# Patient Record
Sex: Male | Born: 1965 | Race: White | Hispanic: No | Marital: Married | Smoking: Never smoker
Health system: Southern US, Community
[De-identification: ages and names within clinical notes are randomized; demographics above are authoritative.]

## PROBLEM LIST (undated history)

## (undated) DIAGNOSIS — F32A Depression, unspecified: Secondary | ICD-10-CM

## (undated) DIAGNOSIS — F329 Major depressive disorder, single episode, unspecified: Secondary | ICD-10-CM

## (undated) DIAGNOSIS — F319 Bipolar disorder, unspecified: Secondary | ICD-10-CM

## (undated) DIAGNOSIS — E785 Hyperlipidemia, unspecified: Secondary | ICD-10-CM

## (undated) DIAGNOSIS — I10 Essential (primary) hypertension: Secondary | ICD-10-CM

## (undated) HISTORY — DX: Bipolar disorder, unspecified: F31.9

## (undated) HISTORY — DX: Major depressive disorder, single episode, unspecified: F32.9

## (undated) HISTORY — DX: Depression, unspecified: F32.A

---

## 2013-10-16 ENCOUNTER — Encounter: Payer: Self-pay | Admitting: Family Medicine

## 2013-10-16 ENCOUNTER — Ambulatory Visit (INDEPENDENT_AMBULATORY_CARE_PROVIDER_SITE_OTHER): Payer: 59 | Admitting: Family Medicine

## 2013-10-16 VITALS — BP 121/80 | HR 106 | Ht 71.0 in | Wt 226.0 lb

## 2013-10-16 DIAGNOSIS — F319 Bipolar disorder, unspecified: Secondary | ICD-10-CM | POA: Insufficient documentation

## 2013-10-16 DIAGNOSIS — R0989 Other specified symptoms and signs involving the circulatory and respiratory systems: Secondary | ICD-10-CM

## 2013-10-16 DIAGNOSIS — R0609 Other forms of dyspnea: Secondary | ICD-10-CM

## 2013-10-16 DIAGNOSIS — Z131 Encounter for screening for diabetes mellitus: Secondary | ICD-10-CM

## 2013-10-16 DIAGNOSIS — R0683 Snoring: Secondary | ICD-10-CM | POA: Insufficient documentation

## 2013-10-16 DIAGNOSIS — Z13220 Encounter for screening for lipoid disorders: Secondary | ICD-10-CM

## 2013-10-16 DIAGNOSIS — G479 Sleep disorder, unspecified: Secondary | ICD-10-CM

## 2013-10-16 NOTE — Progress Notes (Signed)
CC: Mark Stevens is a 48 y.o. male is here for Establish Care and Sleep Apnea   Subjective: HPI:  Very pleasant 48 year old here to establish care  Reports that he was once diagnosed with obstructive sleep apnea and prescribed a CPAP machine he admits that he used this intermittently he is not sure if it helped much but he believes it was somewhat of an annoyance due to discomfort that he felt better not using it rather than using it. This was many years ago. His wife has noticed that he stops breathing during his sleep and snores throughout the night. He often appears startled when he briefly stopped breathing. This occurs every night it has not been getting better or worse over the past at least 3+ months. Nothing particularly makes better or worse.  He admits to occasional nonrestorative sleep, occasional daytime sleepiness mild in severity.  Patient states he has not been screened for diabetes or dyslipidemia in the last year  Review of Systems - General ROS: negative for - chills, fever, night sweats, weight gain or weight loss Ophthalmic ROS: negative for - decreased vision Psychological ROS: negative for - anxiety or depression ENT ROS: negative for - hearing change, nasal congestion, tinnitus or allergies Hematological and Lymphatic ROS: negative for - bleeding problems, bruising or swollen lymph nodes Breast ROS: negative Respiratory ROS: no cough, shortness of breath, or wheezing Cardiovascular ROS: no chest pain or dyspnea on exertion Gastrointestinal ROS: no abdominal pain, change in bowel habits, or black or bloody stools Genito-Urinary ROS: negative for - genital discharge, genital ulcers, incontinence or abnormal bleeding from genitals Musculoskeletal ROS: negative for - joint pain or muscle pain Neurological ROS: negative for - headaches or memory loss Dermatological ROS: negative for lumps, mole changes, rash and skin lesion changes  Past Medical History  Diagnosis Date   . Depression   . Bipolar 1 disorder      Family History  Problem Relation Age of Onset  . Prostate cancer      uncle  . Atrial fibrillation Father   . Congestive Heart Failure       History  Substance Use Topics  . Smoking status: Never Smoker   . Smokeless tobacco: Not on file  . Alcohol Use: No     Objective: Filed Vitals:   10/16/13 1508  BP: 121/80  Pulse: 106    General: Alert and Oriented, No Acute Distress HEENT: Pupils equal, round, reactive to light. Conjunctivae clear.  External ears unremarkable, canals clear with intact TMs with appropriate landmarks.  Middle ear appears open without effusion. Pink inferior turbinates.  Moist mucous membranes, pharynx without inflammation nor lesions.  Neck supple without palpable lymphadenopathy nor abnormal masses. Lungs: Clear to auscultation bilaterally, no wheezing/ronchi/rales.  Comfortable work of breathing. Good air movement. Cardiac: Regular rate and rhythm. Normal S1/S2.  No murmurs, rubs, nor gallops.   Abdomen: Obese Extremities: No peripheral edema.  Strong peripheral pulses.  Mental Status: No depression, anxiety, nor agitation. Skin: Warm and dry.  Assessment & Plan: Lamonta was seen today for establish care and sleep apnea.  Diagnoses and associated orders for this visit:  Sleep disturbance - Split night study; Future  Snoring - Split night study; Future  Diabetes mellitus screening - BASIC METABOLIC PANEL WITH GFR  Screening, lipid - Lipid Profile  Bipolar 1 disorder  Other Orders - Cancel: Nocturnal polysomnography (NPSG); Future    We will be ordering split sleep study for diagnosis and titration of my suspicion of  objective sleep apnea. Currently this is uncontrolled. He is due for lipid screening and diabetic screening he is not fasting so this done next week He declines flu shot today  Return if symptoms worsen or fail to improve.

## 2013-11-18 ENCOUNTER — Ambulatory Visit (HOSPITAL_BASED_OUTPATIENT_CLINIC_OR_DEPARTMENT_OTHER): Payer: 59 | Admitting: Radiology

## 2013-11-23 ENCOUNTER — Ambulatory Visit (HOSPITAL_BASED_OUTPATIENT_CLINIC_OR_DEPARTMENT_OTHER): Payer: 59 | Attending: Family Medicine | Admitting: Radiology

## 2013-11-23 VITALS — Ht 70.0 in | Wt 226.0 lb

## 2013-11-23 DIAGNOSIS — G479 Sleep disorder, unspecified: Secondary | ICD-10-CM

## 2013-11-23 DIAGNOSIS — G4733 Obstructive sleep apnea (adult) (pediatric): Secondary | ICD-10-CM | POA: Insufficient documentation

## 2013-11-23 DIAGNOSIS — R0683 Snoring: Secondary | ICD-10-CM

## 2013-11-26 DIAGNOSIS — R0989 Other specified symptoms and signs involving the circulatory and respiratory systems: Secondary | ICD-10-CM

## 2013-11-26 DIAGNOSIS — R0609 Other forms of dyspnea: Secondary | ICD-10-CM

## 2013-11-26 DIAGNOSIS — G479 Sleep disorder, unspecified: Secondary | ICD-10-CM

## 2013-11-26 NOTE — Sleep Study (Signed)
   NAME: Mark Stevens DATE OF BIRTH:  08-Mar-1966 MEDICAL RECORD NUMBER [id number]  LOCATION: [facility]  PHYSICIAN: YOUNG,CLINTON D  DATE OF STUDY: 11/23/2013  SLEEP STUDY TYPE: Nocturnal Polysomnogram               REFERRING PHYSICIAN: Marcial Pacas, DO  INDICATION FOR STUDY: Hypersomnia with sleep apnea  EPWORTH SLEEPINESS SCORE:   7/24   HEIGHT: 5\' 10"  (177.8 cm)  WEIGHT: 226 lb (102.513 kg)    Body mass index is 32.43 kg/(m^2).  NECK SIZE: 17 in.  MEDICATIONS: Charted for review  SLEEP ARCHITECTURE: Total sleep time 354.5 minutes with sleep efficiency 86.8%. Stage I was 11%, stage II 60.5%, stage III absent, REM 28.5% of total sleep time. Sleep latency 31 minutes, REM latency 81 minutes, awake after sleep onset 23 minutes, arousal index 28.9. Bedtime medication: None  RESPIRATORY DATA: Apnea hypopneas index (AHI) of 5.9 per hour. 35 total events counted of which 2 were central apneas, 33 hypopneas. Events were more common while supine. REM AHI 13.1 per hour. There were not enough events to qualify for split protocol CPAP titration on this study.  OXYGEN DATA: Moderately loud snoring with oxygen desaturation to a nadir of 85% and a mean oxygen saturation through the study of 93.6% on room air.  CARDIAC DATA: Sinus rhythm with PACs and PVCs  MOVEMENT/PARASOMNIA: A few limb jerks were counted with little effect on sleep. Bathroom x1.  IMPRESSION/ RECOMMENDATION:   1) Minimal obstructive sleep apnea/hypoxia syndrome, AHI 5.9 per hour with events more common while supine. REM AHI 13.1 per hour. The normal range for adults is an AHI of 0-5 events per hour. Moderately loud snoring with oxygen desaturation to a nadir of 85% and a mean oxygen saturation through the study of 93.6% on room air. 2) Scores in this range are usually managed conservatively. Weight loss and encouragement to sleep off flat of back pain be helpful. An oral appliance or CPAP would be  individual considerations.   Signed Baird Lyons M.D. Deneise Lever Diplomate, American Board of Sleep Medicine  ELECTRONICALLY SIGNED ON:  11/26/2013, 1:02 PM [place] PH: [phone]   FX: [phone] [place]

## 2013-12-03 ENCOUNTER — Telehealth: Payer: Self-pay | Admitting: Family Medicine

## 2013-12-03 DIAGNOSIS — G4733 Obstructive sleep apnea (adult) (pediatric): Secondary | ICD-10-CM

## 2013-12-03 NOTE — Telephone Encounter (Signed)
Seth Bake, Will you please let patient know that his sleep study revealed mild sleep apnea.  At this severity it is typically a patient preference regarding treatment being either weight loss and/or CPAP.  If he is interested in CPAP please let me know and we can see if [facility] can help arrange this accomodation.

## 2013-12-03 NOTE — Telephone Encounter (Signed)
Pt's spouse notified. He does want to go ahead and proceed with Cpap machine.

## 2013-12-07 MED ORDER — AMBULATORY NON FORMULARY MEDICATION: Status: DC

## 2013-12-07 NOTE — Telephone Encounter (Signed)
Seth Bake, Can you please see if [facility] or another provider can help arrange this, order has been signed and placed in your inbox.

## 2013-12-09 NOTE — Telephone Encounter (Signed)
This order and and notes were faxed on the 9th. Pt called up front since he had not heard anything from [facility]. Mardene Celeste called [facility] and they did receive the order but they said they didn't receive the notes. refaxing notes

## 2013-12-10 ENCOUNTER — Other Ambulatory Visit: Payer: Self-pay | Admitting: *Deleted

## 2013-12-10 MED ORDER — AMBULATORY NON FORMULARY MEDICATION: Status: DC

## 2013-12-24 ENCOUNTER — Encounter: Payer: Self-pay | Admitting: Family Medicine

## 2013-12-29 ENCOUNTER — Telehealth: Payer: Self-pay | Admitting: *Deleted

## 2013-12-29 NOTE — Telephone Encounter (Signed)
Pt called because he was upset that no one has let him know what the sleep study report said. I did tell the pt that I did call him but on his vm the name says Ronalee Belts instead of Audry so I didn't leave a message with results because I didn't know it that was his vm. So I called Florentina Jenny downstairs to verify that that was her husband and she did tell me yes and I did tell her the results and she  told me at that time that he wanted to proceed with CPAP. I did apologize to patient and I only spoke with karla about the patient because she was with him at his appt and was aware of what was going on. Pt states that wasn't her understanding. I then asked if he wanted me to read the results to him and he agreed. i read him the phone note Dr. Ileene Rubens had wrote in his chart. He then asked to be transferred up front so that he could schedule an appt. Pt was transferred to [facility]

## 2013-12-31 ENCOUNTER — Encounter: Payer: Self-pay | Admitting: Family Medicine

## 2013-12-31 ENCOUNTER — Ambulatory Visit: Payer: 59 | Admitting: Family Medicine

## 2013-12-31 ENCOUNTER — Ambulatory Visit (INDEPENDENT_AMBULATORY_CARE_PROVIDER_SITE_OTHER): Payer: 59 | Admitting: Family Medicine

## 2013-12-31 VITALS — BP 144/87 | HR 85 | Wt 226.0 lb

## 2013-12-31 DIAGNOSIS — G4733 Obstructive sleep apnea (adult) (pediatric): Secondary | ICD-10-CM

## 2013-12-31 NOTE — Progress Notes (Signed)
CC: Mark Stevens is a 48 y.o. male is here for f/u sleep study   Subjective: HPI:  Followup sleep apnea: Since March 25 he has been using CPAP on a nightly basis for greater than 4 hours every night of the week. His wife states he Stevens longer snores. Patient states that he is feeling much more rested when he awakes in the morning. Becomes fatigued late in the evening but Stevens fatigue at all during the daytime. Denies any intolerance or side effects to using CPAP machine.  Denies headache, shortness of breath, chest pain, cough, wheezing, nor mental disturbance   Review Of Systems Outlined In HPI  Past Medical History  Diagnosis Date  . Depression   . Bipolar 1 disorder     Stevens past surgical history on file. Family History  Problem Relation Age of Onset  . Prostate cancer      uncle  . Atrial fibrillation Father   . Congestive Heart Failure      History   Social History  . Marital Status: Married    Spouse Name: N/A    Number of Children: N/A  . Years of Education: N/A   Occupational History  . Not on file.   Social History Main Topics  . Smoking status: Never Smoker   . Smokeless tobacco: Not on file  . Alcohol Use: Stevens  . Drug Use: Stevens  . Sexual Activity: Not Currently    Partners: Female   Other Topics Concern  . Not on file   Social History Narrative  . Stevens narrative on file     Objective: BP 144/87  Pulse 85  Wt 226 lb (102.513 kg)  General: Alert and Oriented, Stevens Acute Distress HEENT: Pupils equal, round, reactive to light. Conjunctivae clear.  Moist membranes pharynx are remarkable Lungs: Clear to auscultation bilaterally, Stevens wheezing/ronchi/rales.  Comfortable work of breathing. Good air movement. Cardiac: Regular rate and rhythm. Normal S1/S2.  Stevens murmurs, rubs, nor gallops.   Mental Status: Stevens depression, anxiety, nor agitation. Skin: Warm and dry.  Assessment & Plan: Mark Stevens was seen today for f/u sleep study.  Diagnoses and associated orders for this  visit:  Obstructive sleep apnea     Obstructive sleep apnea: Controlled continue nightly CPAP use, I like him to provide me with a diary of blood pressures over the next 2 weeks as I would expect his blood pressure should be improving with continued CPAP use. He can drop this off in our office anytime To determine if he needs more closer followup   Return in about 3 months (around 04/01/2014), or if symptoms worsen or fail to improve.

## 2013-12-31 NOTE — Patient Instructions (Signed)
Please check your blood pressure three times a week and drop it off for me to review in two weeks.

## 2014-01-21 ENCOUNTER — Encounter: Payer: Self-pay | Admitting: Family Medicine

## 2014-01-29 ENCOUNTER — Telehealth: Payer: Self-pay | Admitting: Family Medicine

## 2014-01-29 NOTE — Telephone Encounter (Signed)
Mark Stevens, Will you please thank Mark Stevens and his wife Mark Stevens) for dropping off his BP numbers last week.  I forgot to communicate to them that the numbers look fantastic. FU around July for routine f/u of sleep apnea.

## 2014-01-29 NOTE — Telephone Encounter (Signed)
Pt.notified

## 2014-02-11 ENCOUNTER — Encounter: Payer: Self-pay | Admitting: Family Medicine

## 2014-02-25 ENCOUNTER — Encounter: Payer: Self-pay | Admitting: Family Medicine

## 2014-02-25 ENCOUNTER — Ambulatory Visit (INDEPENDENT_AMBULATORY_CARE_PROVIDER_SITE_OTHER): Payer: 59 | Admitting: Family Medicine

## 2014-02-25 VITALS — BP 135/82 | HR 70 | Wt 221.0 lb

## 2014-02-25 DIAGNOSIS — L919 Hypertrophic disorder of the skin, unspecified: Secondary | ICD-10-CM

## 2014-02-25 DIAGNOSIS — L918 Other hypertrophic disorders of the skin: Secondary | ICD-10-CM

## 2014-02-25 DIAGNOSIS — L909 Atrophic disorder of skin, unspecified: Secondary | ICD-10-CM

## 2014-02-25 DIAGNOSIS — L089 Local infection of the skin and subcutaneous tissue, unspecified: Secondary | ICD-10-CM

## 2014-02-25 NOTE — Progress Notes (Signed)
CC: Mark Stevens is a 48 y.o. male is here for skin tag removal   Subjective: HPI:  Painful growths on the skin had been present for the past year that are enlarging on a daily basis. Pain is mild in severity, present on a daily basis, described only as pain, nonradiating, worse with friction, worse when wearing shirts. Nothing particularly makes better. No interventions as of yet. Denies personal history or family history of skin cancer. Denies fevers, chills, nausea, bleeding from wounds, nor unintentional weight loss   Review Of Systems Outlined In HPI  Past Medical History  Diagnosis Date  . Depression   . Bipolar 1 disorder     No past surgical history on file. Family History  Problem Relation Age of Onset  . Prostate cancer      uncle  . Atrial fibrillation Father   . Congestive Heart Failure      History   Social History  . Marital Status: Married    Spouse Name: N/A    Number of Children: N/A  . Years of Education: N/A   Occupational History  . Not on file.   Social History Main Topics  . Smoking status: Never Smoker   . Smokeless tobacco: Not on file  . Alcohol Use: No  . Drug Use: No  . Sexual Activity: Not Currently    Partners: Female   Other Topics Concern  . Not on file   Social History Narrative  . No narrative on file     Objective: BP 135/82  Pulse 70  Wt 221 lb (100.245 kg)  Vital signs reviewed. General: Alert and Oriented, No Acute Distress HEENT: Pupils equal, round, reactive to light. Conjunctivae clear.  External ears unremarkable.  Moist mucous membranes. Lungs: Clear and comfortable work of breathing, speaking in full sentences without accessory muscle use. Cardiac: Regular rate and rhythm.  Neuro: CN II-XII grossly intact, gait normal. Extremities: No peripheral edema.  Strong peripheral pulses.  Mental Status: No depression, anxiety, nor agitation. Logical though process. Skin: Warm and dry. Tender and inflamed skin tags  located underneath the right eyebrow x1, right X3, left X2 all of which are quite tender to the touch and erythematous.  Assessment & Plan: [name] was seen today for skin tag removal.  Diagnoses and associated orders for this visit:  Inflamed skin tag    Discussed different methods of removing the skin tags which are causing pain. He ultimately decided to have each removed in this fashion below. After verbal consent was obtained chlorhexidine was used to clean all sites of the skin tags, using forceps and sterile scissors skin tags were removed at the base after anesthetizing with topical cold spray. Hemostasis was easily achieved at all sites with aluminum chloride and gentle pressure. Discussed wound care and signs and symptoms that would require reevaluation.  25 minutes spent face-to-face during visit today of which at least 50% was counseling or coordinating care regarding: 1. Inflamed skin tag      Return if symptoms worsen or fail to improve.

## 2014-04-27 ENCOUNTER — Telehealth: Payer: Self-pay | Admitting: Family Medicine

## 2014-04-27 MED ORDER — AMBULATORY NON FORMULARY MEDICATION: Status: DC

## 2014-04-27 NOTE — Telephone Encounter (Signed)
Switching resp supply company

## 2014-05-19 ENCOUNTER — Ambulatory Visit (INDEPENDENT_AMBULATORY_CARE_PROVIDER_SITE_OTHER): Payer: 59 | Admitting: Family Medicine

## 2014-05-19 ENCOUNTER — Encounter: Payer: Self-pay | Admitting: Family Medicine

## 2014-05-19 VITALS — BP 129/80 | HR 77 | Wt 226.0 lb

## 2014-05-19 DIAGNOSIS — L237 Allergic contact dermatitis due to plants, except food: Secondary | ICD-10-CM

## 2014-05-19 DIAGNOSIS — L255 Unspecified contact dermatitis due to plants, except food: Secondary | ICD-10-CM

## 2014-05-19 MED ORDER — HYDROXYZINE HCL 25 MG PO TABS: 25.0000 mg | ORAL_TABLET | Freq: Three times a day (TID) | ORAL | Status: DC | PRN

## 2014-05-19 MED ORDER — TRIAMCINOLONE ACETONIDE 0.1 % EX CREA: TOPICAL_CREAM | CUTANEOUS | Status: DC

## 2014-05-19 NOTE — Progress Notes (Signed)
CC: Mark Stevens is a 48 y.o. male is here for rash on abdomen and hand   Subjective: HPI:  Patient is a rash localized on the abdomen in the right lower and left quadrant additionally on the left forearm. Symptoms began 3 days ago and are described as itchy severe in severity. Does not seem to be spreading since gradual onset over the course of the day. Has never been painful. He's concerned he might have shingles. Symptoms began 3-4 days after he was working in his yard around dense vines.  He feels like he is in his normal state of health otherwise. Denies fevers, chills, joint pain, myalgias nor any motor sensory disturbances   Review Of Systems Outlined In HPI  Past Medical History  Diagnosis Date  . Depression   . Bipolar 1 disorder     No past surgical history on file. Family History  Problem Relation Age of Onset  . Prostate cancer      uncle  . Atrial fibrillation Father   . Congestive Heart Failure      History   Social History  . Marital Status: Married    Spouse Name: N/A    Number of Children: N/A  . Years of Education: N/A   Occupational History  . Not on file.   Social History Main Topics  . Smoking status: Never Smoker   . Smokeless tobacco: Not on file  . Alcohol Use: No  . Drug Use: No  . Sexual Activity: Not Currently    Partners: Female   Other Topics Concern  . Not on file   Social History Narrative  . No narrative on file     Objective: BP 129/80  Pulse 77  Wt 226 lb (102.513 kg)  General: Alert and Oriented, No Acute Distress HEENT: Pupils equal, round, reactive to light. Conjunctivae clear.  Moist mucous membranes pharynx unremarkable Lungs: Clear to auscultation bilaterally, no wheezing/ronchi/rales.  Comfortable work of breathing. Good air movement. Cardiac: Regular rate and rhythm. Normal S1/S2.  No murmurs, rubs, nor gallops.   Abdomen: Soft nontender Extremities: No peripheral edema.  Strong peripheral pulses.  Mental Status:  No depression, anxiety, nor agitation. Skin: Warm and dry. Linear groups of erythematous vesicles with clear fluid in the following locations: 2 cm shriek on the left dorsal forearm, 1 cm streak on the left volar forearm, 1.5 cm patch on the left lower quadrant and right lower quadrant at the waistband  Assessment & Plan: Mark Stevens was seen today for rash on abdomen and hand.  Diagnoses and associated orders for this visit:  Poison ivy dermatitis - triamcinolone cream (KENALOG) 0.1 %; Apply to affected areas twice a day for up to two weeks, avoid face. - hydrOXYzine (ATARAX/VISTARIL) 25 MG tablet; Take 1 tablet (25 mg total) by mouth 3 (three) times daily as needed for itching.    Avoiding systemic corticosteroids due to concerns of worsening bipolar disorder, start triamcinolone cream at the locations of the rash, hydroxyzine as needed for itching, and anticipate symptoms will not improve after one to 2 days. Reassurance provided that clinical suspicion of shingles is extremely low.  Return if symptoms worsen or fail to improve.

## 2014-05-25 ENCOUNTER — Telehealth: Payer: Self-pay | Admitting: *Deleted

## 2014-05-25 NOTE — Telephone Encounter (Signed)
Pt has a headache today and cannot go into his part time job. Would like a note stating he has a headache and cannot come in today.Ok per [employer]

## 2014-10-22 ENCOUNTER — Ambulatory Visit: Payer: 59 | Admitting: Family Medicine

## 2014-11-02 ENCOUNTER — Ambulatory Visit (INDEPENDENT_AMBULATORY_CARE_PROVIDER_SITE_OTHER): Payer: 59 | Admitting: Family Medicine

## 2014-11-02 ENCOUNTER — Encounter: Payer: Self-pay | Admitting: Family Medicine

## 2014-11-02 VITALS — BP 143/89 | HR 96 | Temp 98.1°F | Wt 245.0 lb

## 2014-11-02 DIAGNOSIS — A499 Bacterial infection, unspecified: Secondary | ICD-10-CM

## 2014-11-02 DIAGNOSIS — B9689 Other specified bacterial agents as the cause of diseases classified elsewhere: Secondary | ICD-10-CM

## 2014-11-02 DIAGNOSIS — I10 Essential (primary) hypertension: Secondary | ICD-10-CM

## 2014-11-02 DIAGNOSIS — J329 Chronic sinusitis, unspecified: Secondary | ICD-10-CM

## 2014-11-02 MED ORDER — LISINOPRIL 20 MG PO TABS: ORAL_TABLET | ORAL | Status: DC

## 2014-11-02 MED ORDER — AZITHROMYCIN 250 MG PO TABS: ORAL_TABLET | ORAL | Status: AC

## 2014-11-02 NOTE — Progress Notes (Signed)
CC: Mark Stevens is a 49 y.o. male is here for Sinusitis and frequent HA's   Subjective: HPI:  complainsof facial pressure localized between the eyes that radiates into the nose that's been present for the past week. It seems to be persistent since onset. Joined by nasal congestion and postnasal drip with a nonproductive cough. No interventions as of yet. Nothing seems to make it better or worse. accompaniedby fatigue but no fevers or chills.denies shortness of breath cough wheezing or chest pain.   Complains of headaches that have been occurring 3 times a week for the past 2 months. When they're present they will improve with either ibuprofen or Tylenol only to return to or 3 days later. It seems to improve after eating. Nothing else seems to make better or worse. He denies any pounding component photophobia nor phonophobia. It seems to be across the top of the head. It is constant without any pounding component. Denies any company motor or sensory disturbances. Denies fevers, chills, neck pain.when pain is present its mild in severity   Review Of Systems Outlined In HPI  Past Medical History  Diagnosis Date  . Depression   . Bipolar 1 disorder     No past surgical history on file. Family History  Problem Relation Age of Onset  . Prostate cancer      uncle  . Atrial fibrillation Father   . Congestive Heart Failure      History   Social History  . Marital Status: Married    Spouse Name: N/A    Number of Children: N/A  . Years of Education: N/A   Occupational History  . Not on file.   Social History Main Topics  . Smoking status: Never Smoker   . Smokeless tobacco: Not on file  . Alcohol Use: No  . Drug Use: No  . Sexual Activity:    Partners: Female   Other Topics Concern  . Not on file   Social History Narrative     Objective: BP 143/89 mmHg  Pulse 96  Temp(Src) 98.1 F (36.7 C) (Oral)  Wt 245 lb (111.131 kg)  General: Alert and Oriented, No Acute  Distress HEENT: Pupils equal, round, reactive to light. Conjunctivae clear.  External ears unremarkable, canals clear with intact TMs with appropriate landmarks.  Middle ear appears open without effusion. Pink inferior turbinates with moderate mucoid discharge in the nares.  Moist mucous membranes, pharynx without inflammation nor lesions.  Neck supple without palpable lymphadenopathy nor abnormal masses. Lungs: Clear to auscultation bilaterally, no wheezing/ronchi/rales.  Comfortable work of breathing. Good air movement. Cardiac: Regular rate and rhythm. Normal S1/S2.  No murmurs, rubs, nor gallops.   Mental Status: No depression, anxiety, nor agitation. Skin: Warm and dry.  Assessment & Plan: Mark Stevens was seen today for sinusitis and frequent ha's.  Diagnoses and associated orders for this visit:  Essential hypertension - lisinopril (PRINIVIL,ZESTRIL) 20 MG tablet; One tablet by mouth daily for blood pressure control.  Bacterial sinusitis - azithromycin (ZITHROMAX) 250 MG tablet; Take two tabs at once on day 1, then one tab daily on days 2-5.    Essential hypertension: Discussed new illness with patient and management with reduction of sodium and beginning lisinopril. I'm optimistic that his headaches will improve with better blood pressure control. No red flags for neuroimaging Bacterial sinusitis: Start azithromycin consider nasal saline washes  Return in about 4 weeks (around 11/30/2014).

## 2014-12-03 ENCOUNTER — Encounter: Payer: Self-pay | Admitting: Family Medicine

## 2014-12-03 ENCOUNTER — Ambulatory Visit (INDEPENDENT_AMBULATORY_CARE_PROVIDER_SITE_OTHER): Payer: 59 | Admitting: Family Medicine

## 2014-12-03 VITALS — BP 135/81 | HR 77 | Wt 247.0 lb

## 2014-12-03 DIAGNOSIS — I10 Essential (primary) hypertension: Secondary | ICD-10-CM

## 2014-12-03 MED ORDER — LISINOPRIL 20 MG PO TABS: ORAL_TABLET | ORAL | Status: DC

## 2014-12-03 NOTE — Progress Notes (Signed)
CC: Mark Stevens is a 49 y.o. male is here for Hypertension   Subjective: HPI:  Follow-up essential hypertension: Since taking lisinopril 20 mg daily he's no longer having any headaches. Outside blood pressures are consistently in the normotensive range when taken by his wife. He had bacon, sausage, and fried fish earlier today but otherwise is watching his sodium intake. No formal exercise activity. No shortness of breath orthopnea nor peripheral edema. No cough or angioedema or motor or sensory disturbances   Review Of Systems Outlined In HPI  Past Medical History  Diagnosis Date  . Depression   . Bipolar 1 disorder     No past surgical history on file. Family History  Problem Relation Age of Onset  . Prostate cancer      uncle  . Atrial fibrillation Father   . Congestive Heart Failure      History   Social History  . Marital Status: Married    Spouse Name: N/A  . Number of Children: N/A  . Years of Education: N/A   Occupational History  . Not on file.   Social History Main Topics  . Smoking status: Never Smoker   . Smokeless tobacco: Not on file  . Alcohol Use: No  . Drug Use: No  . Sexual Activity:    Partners: Female   Other Topics Concern  . Not on file   Social History Narrative     Objective: BP 135/81 mmHg  Pulse 77  Wt 247 lb (112.038 kg)  General: Alert and Oriented, No Acute Distress HEENT: Pupils equal, round, reactive to light. Conjunctivae clear.  Moist mucous membranes Lungs: Clear to auscultation bilaterally, no wheezing/ronchi/rales.  Comfortable work of breathing. Good air movement. Cardiac: Regular rate and rhythm. Normal S1/S2.  No murmurs, rubs, nor gallops.   Extremities: No peripheral edema.  Strong peripheral pulses.  Mental Status: No depression, anxiety, nor agitation. Skin: Warm and dry.  Assessment & Plan: Mark Stevens was seen today for hypertension.  Diagnoses and all orders for this visit:  Essential  hypertension Orders: -     lisinopril (PRINIVIL,ZESTRIL) 20 MG tablet; One tablet by mouth daily for blood pressure control. -     BASIC METABOLIC PANEL WITH GFR   Essential hypertension: Controlled continue lisinopril pending renal function and potassium  Return in about 6 months (around 06/05/2015) for BP.

## 2014-12-04 LAB — BASIC METABOLIC PANEL WITH GFR
BUN: 12 mg/dL (ref 6–23)
CO2: 28 mEq/L (ref 19–32)
Calcium: 9.2 mg/dL (ref 8.4–10.5)
Chloride: 102 mEq/L (ref 96–112)
Creat: 1.16 mg/dL (ref 0.50–1.35)
GFR, Est African American: 86 mL/min
GFR, Est Non African American: 74 mL/min
Glucose, Bld: 93 mg/dL (ref 70–99)
Potassium: 4.7 mEq/L (ref 3.5–5.3)
Sodium: 139 mEq/L (ref 135–145)

## 2014-12-12 ENCOUNTER — Emergency Department (INDEPENDENT_AMBULATORY_CARE_PROVIDER_SITE_OTHER): Payer: 59

## 2014-12-12 ENCOUNTER — Emergency Department
Admission: EM | Admit: 2014-12-12 | Discharge: 2014-12-12 | Disposition: A | Discharge status: Home / Self Care | Payer: 59 | Source: Home / Self Care | Attending: Family Medicine | Admitting: Family Medicine

## 2014-12-12 DIAGNOSIS — R0981 Nasal congestion: Secondary | ICD-10-CM

## 2014-12-12 DIAGNOSIS — J309 Allergic rhinitis, unspecified: Secondary | ICD-10-CM | POA: Diagnosis not present

## 2014-12-12 MED ORDER — MOMETASONE FUROATE 50 MCG/ACT NA SUSP: NASAL | Status: DC

## 2014-12-12 MED ORDER — PREDNISONE 20 MG PO TABS: 20.0000 mg | ORAL_TABLET | Freq: Two times a day (BID) | ORAL | Status: DC

## 2014-12-12 MED ORDER — AZITHROMYCIN 250 MG PO TABS: ORAL_TABLET | ORAL | Status: DC

## 2014-12-12 NOTE — ED Provider Notes (Signed)
CSN: [id number]     Arrival date & time 12/12/14  1353 History   First MD Initiated Contact with Patient 12/12/14 1453     Chief Complaint  Patient presents with  . Facial Pain    Sinus problem      HPI Comments: Patient reports that he had a sinus infection about 6 weeks ago that resolved after taking a Z-pack. He states that he developed recurrent sinus congestion yesterday with fatigue, facial pressure, and feeling hot but no definite fever.  No URI symptoms.  The history is provided by the patient.    Past Medical History  Diagnosis Date  . Depression   . Bipolar 1 disorder    No past surgical history on file. Family History  Problem Relation Age of Onset  . Prostate cancer      uncle  . Atrial fibrillation Father   . Congestive Heart Failure     History  Substance Use Topics  . Smoking status: Never Smoker   . Smokeless tobacco: Not on file  . Alcohol Use: No    Review of Systems No sore throat No cough No pleuritic pain No wheezing + nasal congestion + post-nasal drainage + sinus pain/pressure No itchy/red eyes No earache No hemoptysis No SOB No fever, ? chills No nausea No vomiting No abdominal pain No diarrhea No urinary symptoms No skin rash + fatigue No myalgias + headache Used OTC meds without relief  Allergies  Epinephrine  Home Medications   Prior to Admission medications   Medication Sig Start Date End Date Taking? Authorizing Provider  azithromycin (ZITHROMAX Z-PAK) 250 MG tablet Take 2 tabs today; then begin one tab once daily for 4 more days. 12/12/14   Kandra Nicolas, MD  buPROPion (WELLBUTRIN XL) 300 MG 24 hr tablet Take 300 mg by mouth daily.    Historical Provider, MD  divalproex (DEPAKOTE ER) 500 MG 24 hr tablet Take by mouth daily.    Historical Provider, MD  lisinopril (PRINIVIL,ZESTRIL) 20 MG tablet One tablet by mouth daily for blood pressure control. 12/03/14 12/03/15  Marcial Pacas, DO  mometasone (NASONEX) 50 MCG/ACT nasal  spray Place 2 sprays in each side of nose once daily 12/12/14   Kandra Nicolas, MD  predniSONE (DELTASONE) 20 MG tablet Take 1 tablet (20 mg total) by mouth 2 (two) times daily. Take with food. 12/12/14   Kandra Nicolas, MD   BP 122/84 mmHg  Pulse 99  Temp(Src) 97.8 F (36.6 C) (Oral)  Wt 245 lb (111.131 kg)  SpO2 97% Physical Exam Nursing notes and Vital Signs reviewed. Appearance:  Patient appears stated age, and in no acute distress Eyes:  Pupils are equal, round, and reactive to light and accomodation.  Extraocular movement is intact.  Conjunctivae are not inflamed  Ears:  Canals normal.  Tympanic membranes normal.  Nose:  Congested turbinates.  Frontal and maxillary sinus tenderness is present.  Pharynx:  Normal Neck:  Supple.  No adenopathy Lungs:  Clear to auscultation.  Breath sounds are equal.  Heart:  Regular rate and rhythm without murmurs, rubs, or gallops.  Abdomen:  Nontender without masses or hepatosplenomegaly.  Bowel sounds are present.  No CVA or flank tenderness.  Extremities:  No edema.  No calf tenderness Skin:  No rash present.   ED Course  Procedures  none  Imaging Review Dg Sinuses Complete  12/12/2014   CLINICAL DATA:  Recent treated sinusitis with improvement. Now recurrent congestion and facial pain 1 day.  EXAM: PARANASAL SINUSES - COMPLETE 3 + VIEW  COMPARISON:  None.  FINDINGS: Paranasal sinuses are well developed and well aerated without significant opacification or air-fluid level. Mastoid air cells are clear. There are mild degenerative changes of the cervical spine.  IMPRESSION: Paranasal sinuses within normal.   Electronically Signed   By: Marin Olp M.D.   On: 12/12/2014 15:31     MDM   1. Allergic rhinitis, unspecified allergic rhinitis type    Begin empiric Z-pack, prednisone burst, and Nasonex nasal spray Use Afrin nasal spray (or generic oxymetazoline) twice daily for about 5 days.  Also recommend using saline nasal spray several times  daily and saline nasal irrigation (AYR is a common brand).  Use Nasonex nasal spray each morning after using Afrin nasal spray and saline nasal irrigation. Followup ENT if not improved two weeks.    Kandra Nicolas, MD 12/14/14 [id number]

## 2014-12-12 NOTE — ED Notes (Signed)
Patient c/o sinus problem, states previously seen and given a zpack a couple of weeks ago, states that the symptoms resolved after medication. Per patient states that sinus symptoms returned yesterday.

## 2014-12-12 NOTE — Discharge Instructions (Signed)
Use Afrin nasal spray (or generic oxymetazoline) twice daily for about 5 days.  Also recommend using saline nasal spray several times daily and saline nasal irrigation (AYR is a common brand).  Use Nasonex nasal spray each morning after using Afrin nasal spray and saline nasal irrigation.

## 2014-12-22 ENCOUNTER — Ambulatory Visit (INDEPENDENT_AMBULATORY_CARE_PROVIDER_SITE_OTHER): Payer: 59 | Admitting: Family Medicine

## 2014-12-22 ENCOUNTER — Encounter: Payer: Self-pay | Admitting: Family Medicine

## 2014-12-22 VITALS — BP 135/86 | HR 96 | Wt 238.0 lb

## 2014-12-22 DIAGNOSIS — H538 Other visual disturbances: Secondary | ICD-10-CM

## 2014-12-22 NOTE — Progress Notes (Signed)
CC: Mark Stevens is a 49 y.o. male is here for Acute Visit   Subjective: HPI:  Vision loss that has been present for about 7 days now. Symptoms began 3 or 4 days after he began prednisone burst at a dose of 40 mg daily. He describes it as equal in both eyes, moderate in severity and is not sure whether or not it's been getting better but definitely not worse since onset last week. Difficulty with viewing up close and far away but more so with distance view. He denies halos or any other distortion of his viewing that he knows of. Nothing seems to make the symptoms better or worse. He denies any other motor or sensory disturbances and has never taken prednisone before. Denies drainage or discharge from the eye. Denies ocular pain. Denies vision loss other than blurring described above. Otherwise feels like he is in his regular state of health and has recovered from a sinus infection with a Z-Pak recently.   Review Of Systems Outlined In HPI  Past Medical History  Diagnosis Date  . Depression   . Bipolar 1 disorder     No past surgical history on file. Family History  Problem Relation Age of Onset  . Prostate cancer      uncle  . Atrial fibrillation Father   . Congestive Heart Failure      History   Social History  . Marital Status: Married    Spouse Name: N/A  . Number of Children: N/A  . Years of Education: N/A   Occupational History  . Not on file.   Social History Main Topics  . Smoking status: Never Smoker   . Smokeless tobacco: Not on file  . Alcohol Use: No  . Drug Use: No  . Sexual Activity:    Partners: Female   Other Topics Concern  . Not on file   Social History Narrative     Objective: BP 135/86 mmHg  Pulse 96  Wt 238 lb (107.956 kg)  SpO2 96%  General: Alert and Oriented, No Acute Distress HEENT: Pupils equal, round, reactive to light. Conjunctivae clear.Moist mucous membranes pharynx unremarkable lungs: Clear to auscultation bilaterally, no  wheezing/ronchi/rales.  Comfortable work of breathing. Good air movement. Cardiac: Regular rate and rhythm. Normal S1/S2.  No murmurs, rubs, nor gallops.   Neuro: Cranial nerves II through XII grossly intact  Extremities: No peripheral edema.  Strong peripheral pulses.  Mental Status: No depression, anxiety, nor agitation. Skin: Warm and dry.  Assessment & Plan: Mark Stevens was seen today for acute visit.  Diagnoses and all orders for this visit:  Blurred vision    blurry vision without ocular pain, at this time feel like this was probably a side effect from prednisone which he has stopped taking. Objectively checked his vision today so we can compare it to next week if this is not subjectively resolved.  Signs and symptoms requring emergent/urgent reevaluation were discussed with the patient.  25 minutes spent face-to-face during visit today of which at least 50% was counseling or coordinating care regarding: 1. Blurred vision      Return for Return in 1-2 weeks if symptoms persist..

## 2014-12-28 ENCOUNTER — Telehealth: Payer: Self-pay | Admitting: Family Medicine

## 2014-12-28 DIAGNOSIS — H538 Other visual disturbances: Secondary | ICD-10-CM

## 2014-12-28 NOTE — Telephone Encounter (Signed)
Message left on vm 

## 2014-12-28 NOTE — Telephone Encounter (Signed)
Seth Bake, Will you please let patient know that I have placed a referral to an ophthalmologist because I would like him to have his ocular pressure measured to rule out glaucoma and that's not a test that we can do at our own office here.

## 2014-12-28 NOTE — Telephone Encounter (Signed)
Pt states that Dr. Ileene Rubens wanted him to call back IF he was still having trouble with his eyes. Patient states that he is still having blurry vision and did not know what Dr.Hommel wanted to do OR if he needs to make another appointment in our office or not? Please let me know and I will be glad to call patient and advise. Thanks, [employer]

## 2014-12-31 ENCOUNTER — Telehealth: Payer: Self-pay | Admitting: Family Medicine

## 2014-12-31 DIAGNOSIS — R739 Hyperglycemia, unspecified: Secondary | ICD-10-CM

## 2014-12-31 NOTE — Telephone Encounter (Signed)
Left message on vm

## 2014-12-31 NOTE — Telephone Encounter (Signed)
He went to eye Dr.yesterday and his Blood sugar was 257 and they wanted him to f/u with his pcp and  A1c check?? Does he need a lab order or a appointment? And could it be from the Prednisone???

## 2014-12-31 NOTE — Telephone Encounter (Signed)
Mark Stevens, That's really strange because his blood sugar earlier in March was normal.  A1c would still be a good idea, lab slip in your inbox.

## 2015-01-05 ENCOUNTER — Encounter: Payer: Self-pay | Admitting: Family Medicine

## 2015-01-05 DIAGNOSIS — R739 Hyperglycemia, unspecified: Secondary | ICD-10-CM | POA: Insufficient documentation

## 2015-01-07 ENCOUNTER — Telehealth: Payer: Self-pay | Admitting: Family Medicine

## 2015-01-07 LAB — HEMOGLOBIN A1C
Hgb A1c MFr Bld: 8.4 % — ABNORMAL HIGH (ref <5.7)
Mean Plasma Glucose: 194 mg/dL — ABNORMAL HIGH (ref <117)

## 2015-01-07 MED ORDER — METFORMIN HCL 1000 MG PO TABS: ORAL_TABLET | ORAL | Status: DC

## 2015-01-07 NOTE — Telephone Encounter (Signed)
Pt.notified

## 2015-01-07 NOTE — Telephone Encounter (Signed)
Mark Stevens, Will you please let patient know that his A1c was 8.4 with normal being less than 5.7.  This is surprising with his blood sugar in March being normal.  I'm wondering if his prednisone significantly threw off his blood sugar or if he's becoming a diabetic.  Regardless, I'd recommend he start taking a gram of metformin daily with a meal and return in one month to go over a plan of checking blood sugars at home outside of our office.

## 2015-02-02 ENCOUNTER — Ambulatory Visit (INDEPENDENT_AMBULATORY_CARE_PROVIDER_SITE_OTHER): Payer: 59 | Admitting: Family Medicine

## 2015-02-02 ENCOUNTER — Encounter: Payer: Self-pay | Admitting: Family Medicine

## 2015-02-02 VITALS — BP 117/70 | HR 73 | Wt 241.0 lb

## 2015-02-02 DIAGNOSIS — G4733 Obstructive sleep apnea (adult) (pediatric): Secondary | ICD-10-CM | POA: Diagnosis not present

## 2015-02-02 DIAGNOSIS — E119 Type 2 diabetes mellitus without complications: Secondary | ICD-10-CM

## 2015-02-02 DIAGNOSIS — I10 Essential (primary) hypertension: Secondary | ICD-10-CM | POA: Diagnosis not present

## 2015-02-02 DIAGNOSIS — R739 Hyperglycemia, unspecified: Secondary | ICD-10-CM | POA: Diagnosis not present

## 2015-02-02 MED ORDER — METFORMIN HCL 1000 MG PO TABS: ORAL_TABLET | ORAL | Status: DC

## 2015-02-02 MED ORDER — LISINOPRIL 20 MG PO TABS: ORAL_TABLET | ORAL | Status: DC

## 2015-02-02 NOTE — Progress Notes (Signed)
CC: Mark Stevens is a 49 y.o. male is here for f/u diabetes   Subjective: HPI:  Follow-up hyperglycemia: Currently taking 1 g of metformin on a nightly basis. blood sugars to report. his vision has not come back completely without any blurring. in hindsight he tells me that he was waking up to go pee 3 or 4 times a night before starting on metformin and now he only gets up 0-1 times a night. Currently denies polyuria plication or polydipsia. No poorly healing wounds. No motor or sensory disturbances  Follow-up essential hypertension: Continues lisinopril 20 mg daily. No cough, chest pain, shortness of breath. Denies any known side effects, no outside blood pressures to report.  Follow-up OSA: Continues to use CPAP on a nightly basis. No longer having nonrestorative sleep. No fatigue during the day.   Review Of Systems Outlined In HPI  Past Medical History  Diagnosis Date  . Depression   . Bipolar 1 disorder     No past surgical history on file. Family History  Problem Relation Age of Onset  . Prostate cancer      uncle  . Atrial fibrillation Father   . Congestive Heart Failure      History   Social History  . Marital Status: Married    Spouse Name: N/A  . Number of Children: N/A  . Years of Education: N/A   Occupational History  . Not on file.   Social History Main Topics  . Smoking status: Never Smoker   . Smokeless tobacco: Not on file  . Alcohol Use: No  . Drug Use: No  . Sexual Activity:    Partners: Female   Other Topics Concern  . Not on file   Social History Narrative     Objective: BP 117/70 mmHg  Pulse 73  Wt 241 lb (109.317 kg)  General: Alert and Oriented, No Acute Distress HEENT: Pupils equal, round, reactive to light. Conjunctivae clear.  Moist mucous membranes Lungs: Clear to auscultation bilaterally, no wheezing/ronchi/rales.  Comfortable work of breathing. Good air movement. Cardiac: Regular rate and rhythm. Normal S1/S2.  No murmurs,  rubs, nor gallops.   Extremities: No peripheral edema.  Strong peripheral pulses.  Mental Status: No depression, anxiety, nor agitation. Skin: Warm and dry.  Assessment & Plan: Mark Stevens was seen today for f/u diabetes.  Diagnoses and all orders for this visit:  Hyperglycemia  Type 2 diabetes mellitus without complication  Essential hypertension Orders: -     lisinopril (PRINIVIL,ZESTRIL) 20 MG tablet; One tablet by mouth daily for blood pressure control.  Obstructive sleep apnea  Other orders -     metFORMIN (GLUCOPHAGE) 1000 MG tablet; One tablet by mouth every evening for blood sugar control.   Type 2 diabetes: Clinically controlled due for repeat A1c in 2 months. If his A1c is normal the intended plan will be to stop metformin to see if his blood sugar rises to test the hypothesis that prednisone was the sole contributor to his elevated blood sugar. If A1c is not normal or remains above normal will continue metformin and discuss additional therapy for diabetes. OSA: Controlled on CPAP nightly Hypertension: Controlled on lisinopril.  Return in about 2 months (around 04/04/2015) for anytime after July 7th.

## 2015-04-11 ENCOUNTER — Ambulatory Visit (INDEPENDENT_AMBULATORY_CARE_PROVIDER_SITE_OTHER): Payer: 59 | Admitting: Family Medicine

## 2015-04-11 ENCOUNTER — Encounter: Payer: Self-pay | Admitting: Family Medicine

## 2015-04-11 VITALS — BP 134/82 | HR 94 | Wt 237.0 lb

## 2015-04-11 DIAGNOSIS — I10 Essential (primary) hypertension: Secondary | ICD-10-CM

## 2015-04-11 DIAGNOSIS — G4733 Obstructive sleep apnea (adult) (pediatric): Secondary | ICD-10-CM | POA: Diagnosis not present

## 2015-04-11 DIAGNOSIS — E119 Type 2 diabetes mellitus without complications: Secondary | ICD-10-CM

## 2015-04-11 LAB — POCT GLYCOSYLATED HEMOGLOBIN (HGB A1C): Hemoglobin A1C: 6.4

## 2015-04-11 MED ORDER — LISINOPRIL 20 MG PO TABS: ORAL_TABLET | ORAL | Status: DC

## 2015-04-11 MED ORDER — METFORMIN HCL 1000 MG PO TABS: ORAL_TABLET | ORAL | Status: DC

## 2015-04-11 NOTE — Progress Notes (Signed)
CC: Mark Stevens is a 49 y.o. male is here for Diabetes   Subjective: HPI:  Follow-up type 2 diabetes: Continues to take metformin 1 g daily without any known side effects. Denies abdominal pain constipation or diarrhea. No outside blood sugars report. Denies polyuria polyphagia polydipsia nor poorly healing wounds.  Follow-up essential hypertension: Taking lisinopril on a daily basis without known side effects. No outside blood pressures to report. No chest pain shortness of breath orthopnea nor peripheral edema  Follow-up obstructive sleep apnea: He tells me he is using his CPAP machine on a nightly basis and feels like it's getting him restorative sleep. He denies any daytime fatigue.   Review Of Systems Outlined In HPI  Past Medical History  Diagnosis Date  . Depression   . Bipolar 1 disorder     No past surgical history on file. Family History  Problem Relation Age of Onset  . Prostate cancer      uncle  . Atrial fibrillation Father   . Congestive Heart Failure      History   Social History  . Marital Status: Married    Spouse Name: N/A  . Number of Children: N/A  . Years of Education: N/A   Occupational History  . Not on file.   Social History Main Topics  . Smoking status: Never Smoker   . Smokeless tobacco: Not on file  . Alcohol Use: No  . Drug Use: No  . Sexual Activity:    Partners: Female   Other Topics Concern  . Not on file   Social History Narrative     Objective: BP 134/82 mmHg  Pulse 94  Wt 237 lb (107.502 kg)  General: Alert and Oriented, No Acute Distress HEENT: Pupils equal, round, reactive to light. Conjunctivae clear.  Moist mucous membranes Lungs: Clear to auscultation bilaterally, no wheezing/ronchi/rales.  Comfortable work of breathing. Good air movement. Cardiac: Regular rate and rhythm. Normal S1/S2.  No murmurs, rubs, nor gallops.   Extremities: No peripheral edema.  Strong peripheral pulses.  Mental Status: No depression,  anxiety, nor agitation. Skin: Warm and dry.  Assessment & Plan: Mark Stevens was seen today for diabetes.  Diagnoses and all orders for this visit:  Essential hypertension Orders: -     lisinopril (PRINIVIL,ZESTRIL) 20 MG tablet; One tablet by mouth daily for blood pressure control.  Type 2 diabetes mellitus without complication Orders: -     POCT HgB A1C  Obstructive sleep apnea  Other orders -     metFORMIN (GLUCOPHAGE) 1000 MG tablet; One tablet by mouth every evening for blood sugar control.   Essential hypertension: Controlled continue lisinopril Type 2 diabetes: A1c of 6.4 today, controlled, joint decision to continue on metformin Objective sleep apnea: Controlled continue nightly CPAP   Return in about 6 months (around 10/12/2015).

## 2015-04-20 IMAGING — CR DG SINUSES COMPLETE 3+V
4 series · 4 of 4 positions shown · non-contrast
Comparison: None.

CLINICAL DATA: Recent treated sinusitis with improvement. Now
recurrent congestion and facial pain 1 day.

EXAM:
PARANASAL SINUSES - COMPLETE 3 + VIEW

[sinus [person_name]]
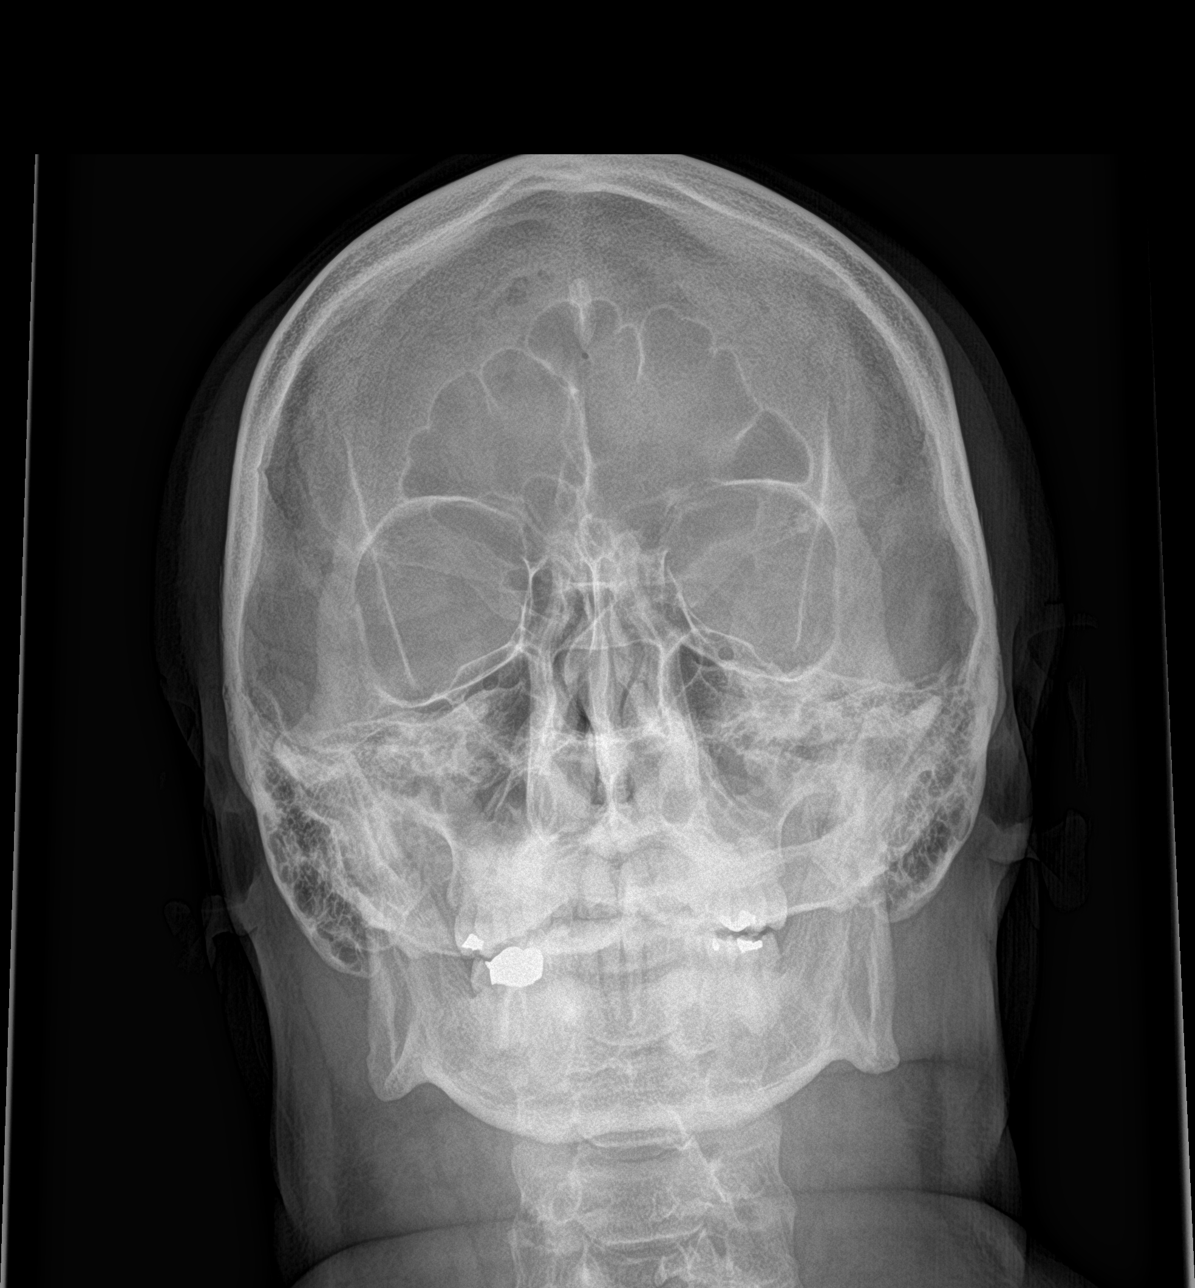

[sinus waters]
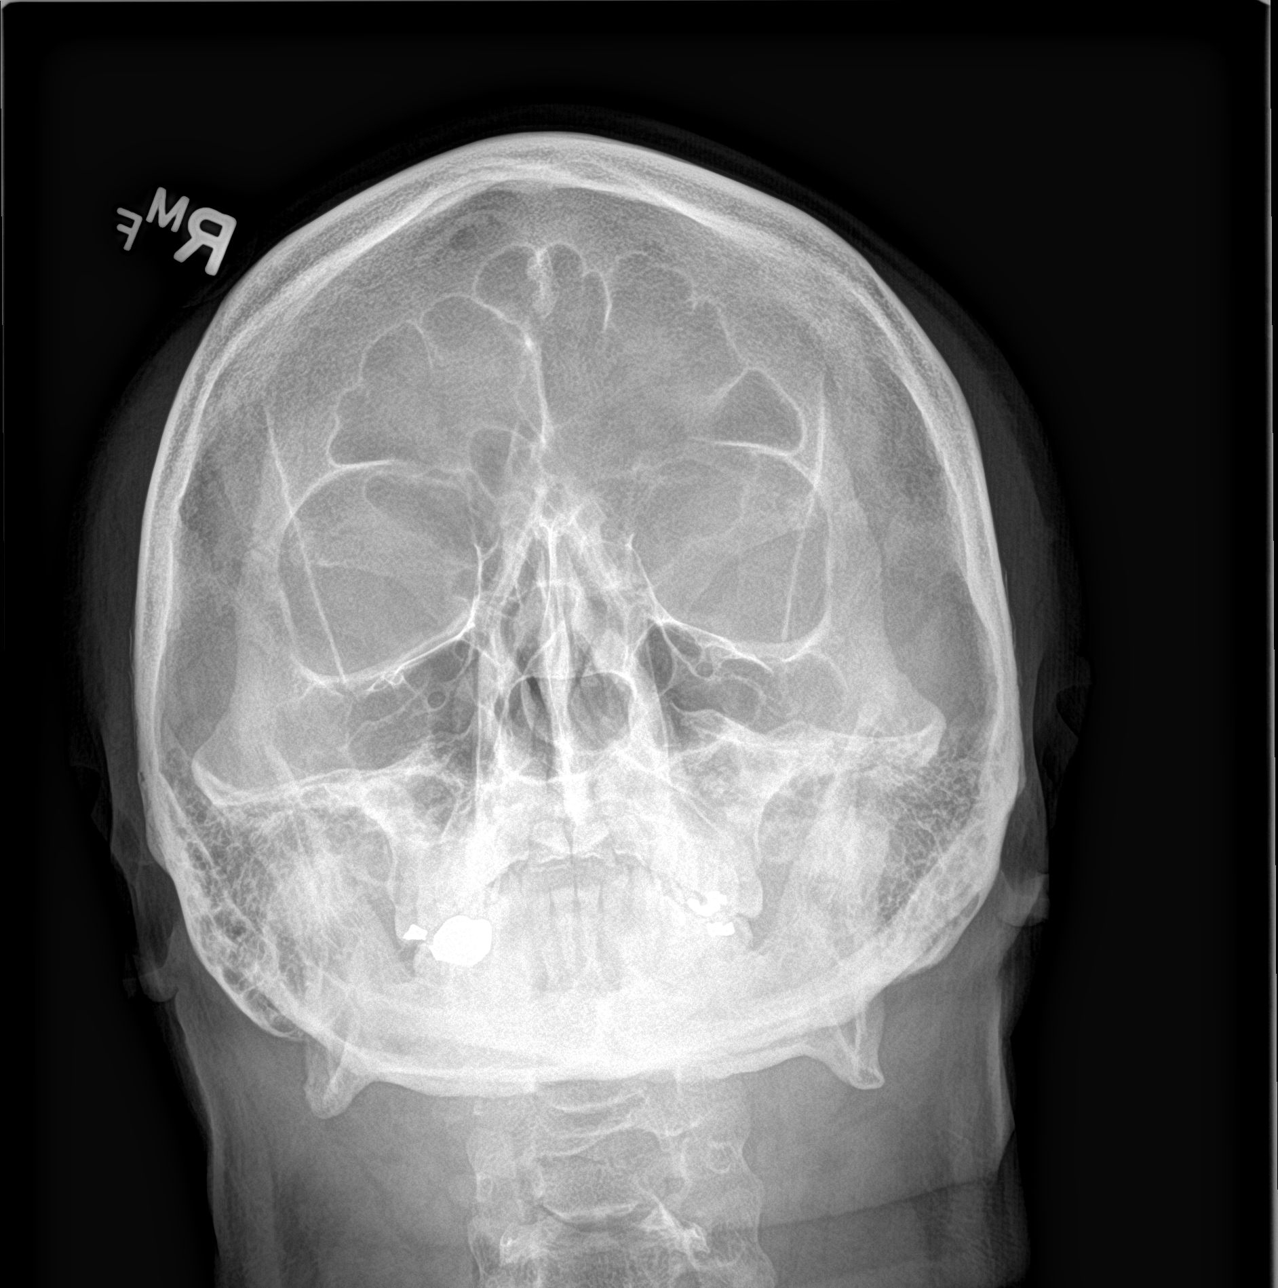

[sinus lat]
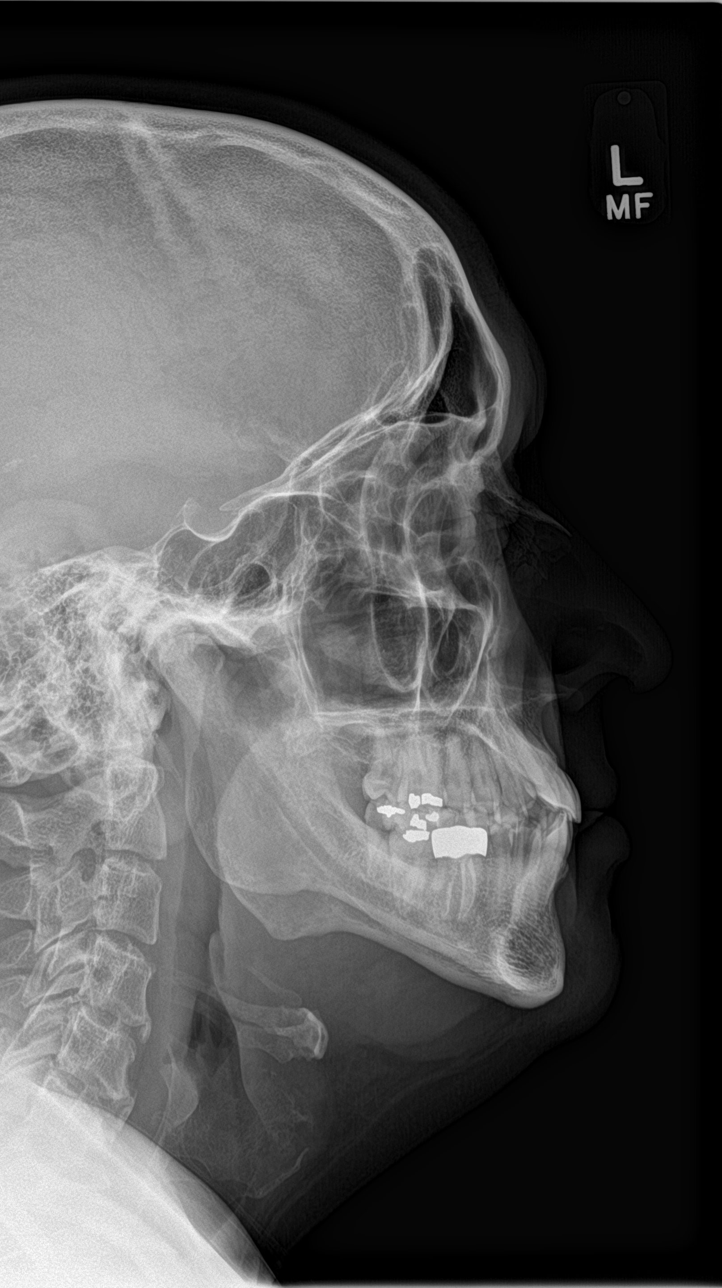

[sinus smv]
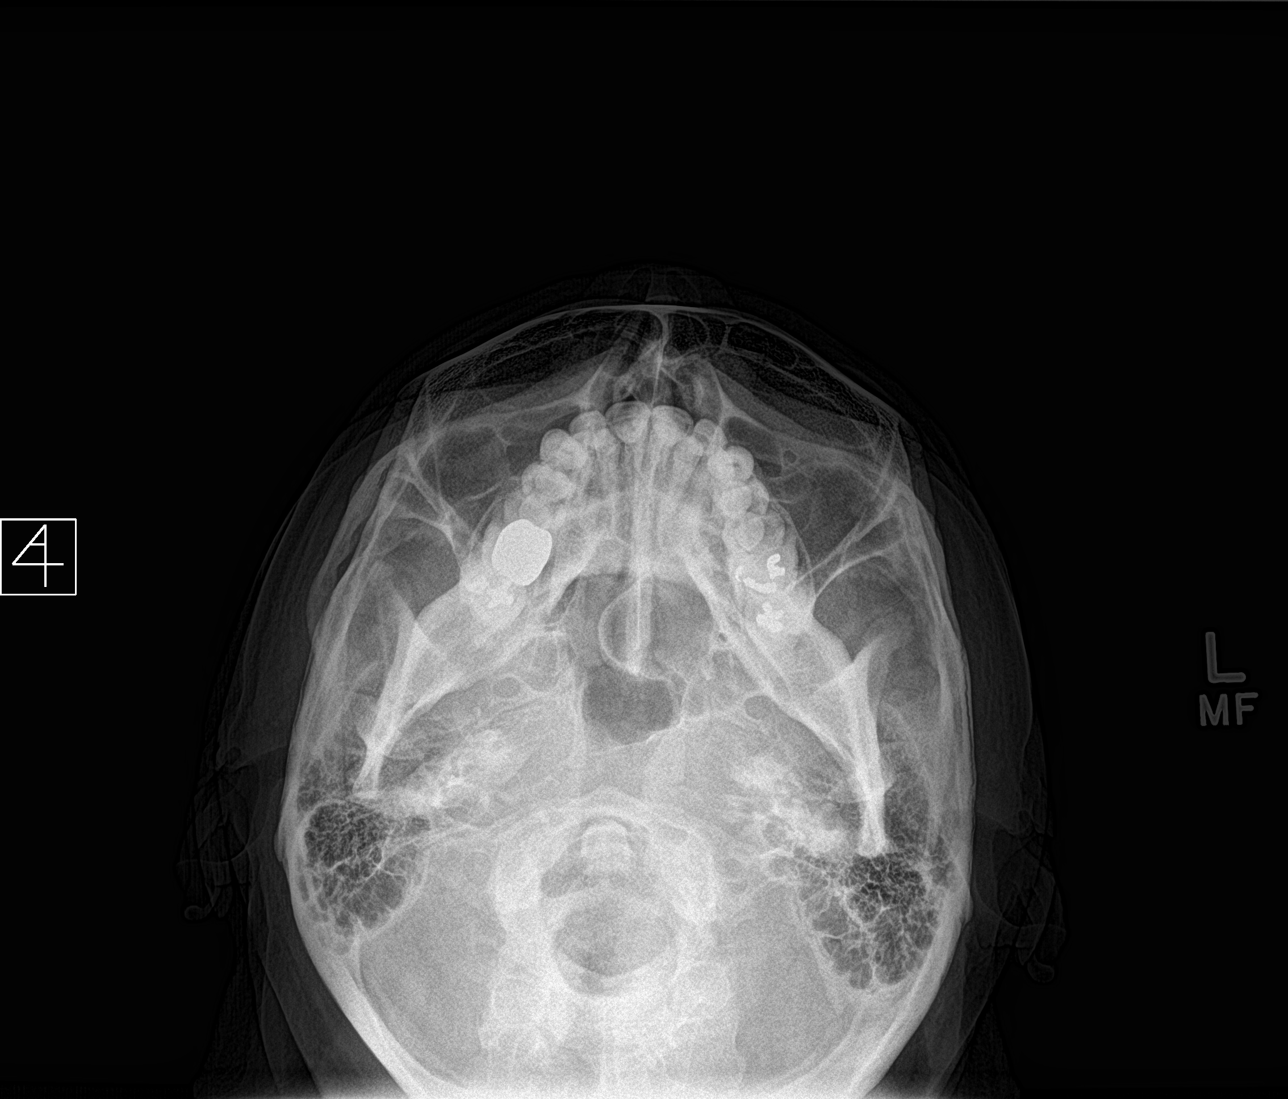

[4 of 4 positions shown; findings below may reference images not displayed]

FINDINGS: Paranasal sinuses are well developed and well aerated without
significant opacification or air-fluid level. Mastoid air cells are
clear. There are mild degenerative changes of the cervical spine.
IMPRESSION: Paranasal sinuses within normal.

## 2015-05-02 ENCOUNTER — Telehealth: Payer: Self-pay | Admitting: *Deleted

## 2015-05-02 MED ORDER — TRIAMCINOLONE ACETONIDE 0.1 % EX CREA: TOPICAL_CREAM | CUTANEOUS | Status: DC

## 2015-05-02 NOTE — Telephone Encounter (Signed)
Pt notified of rx. 

## 2015-05-02 NOTE — Telephone Encounter (Signed)
Yes, Rx sent to [facility].

## 2015-05-02 NOTE — Telephone Encounter (Signed)
Pt left vm stating that he's gotten into some poison ivy again and wanted to know if you'd send him in a refill of the triamcinolone cream.  Please advise.

## 2015-05-05 ENCOUNTER — Encounter: Payer: Self-pay | Admitting: Family Medicine

## 2015-05-05 ENCOUNTER — Ambulatory Visit (INDEPENDENT_AMBULATORY_CARE_PROVIDER_SITE_OTHER): Payer: 59 | Admitting: Family Medicine

## 2015-05-05 VITALS — BP 127/72 | HR 87 | Wt 233.0 lb

## 2015-05-05 DIAGNOSIS — L237 Allergic contact dermatitis due to plants, except food: Secondary | ICD-10-CM

## 2015-05-05 MED ORDER — CLOBETASOL PROPIONATE 0.05 % EX OINT: 1.0000 "application " | TOPICAL_OINTMENT | Freq: Two times a day (BID) | CUTANEOUS | Status: DC

## 2015-05-05 NOTE — Progress Notes (Signed)
Mark Stevens is a 49 y.o. male who presents to [facility]  today for poison ivy. Patient is a week history of a vesicular erythematous rash on both wrists. This happened after he cleared some poison ivy or poison oak. He's tried some 0.1% triamcinolone cream which has not helped much. He notes that he is allergic to prednisone causing eye problems. No fevers chills nausea vomiting or diarrhea. The rash is located only on both wrists. Trouble breathing fevers chills nausea vomiting or diarrhea.   Past Medical History  Diagnosis Date  . Depression   . Bipolar 1 disorder    No past surgical history on file. History  Substance Use Topics  . Smoking status: Never Smoker   . Smokeless tobacco: Not on file  . Alcohol Use: No   ROS as above Medications: Current Outpatient Prescriptions  Medication Sig Dispense Refill  . buPROPion (WELLBUTRIN XL) 300 MG 24 hr tablet Take 300 mg by mouth daily.    . divalproex (DEPAKOTE ER) 500 MG 24 hr tablet Take by mouth daily.    [name] lisinopril (PRINIVIL,ZESTRIL) 20 MG tablet One tablet by mouth daily for blood pressure control. 90 tablet 2  . metFORMIN (GLUCOPHAGE) 1000 MG tablet One tablet by mouth every evening for blood sugar control. 90 tablet 2  . triamcinolone cream (KENALOG) 0.1 % Apply to affected areas twice a day for up to two weeks, avoid face. 80 g 2  . clobetasol ointment (TEMOVATE) 4.46 % Apply 1 application topically 2 (two) times daily. 60 g 1   No current facility-administered medications for this visit.   Allergies  Allergen Reactions  . Epinephrine   . Prednisone     Vision loss     Exam:  BP 127/72 mmHg  Pulse 87  Wt 233 lb (105.688 kg) Gen: Well NAD HEENT: EOMI,  MMM  Exts: Brisk capillary refill, warm and well perfused.  Skin: Erythematous vesicular rash bilateral wrists. Nontender No results found for this or any previous visit (from the past 24 hour(s)). No results  found.   Please see individual assessment and plan sections.

## 2015-05-05 NOTE — Assessment & Plan Note (Signed)
We will increase potency steroid to clobetasol. Follow-up if not better. Avoid oral prednisone if possible.

## 2015-05-05 NOTE — Patient Instructions (Signed)
Thank you for coming in today. Use the new cream two or three times daily   Poison [facility] ivy is a inflammation of the skin (contact dermatitis) caused by touching the allergens on the leaves of the ivy plant following previous exposure to the plant. The rash usually appears 48 hours after exposure. The rash is usually bumps (papules) or blisters (vesicles) in a linear pattern. Depending on your own sensitivity, the rash may simply cause redness and itching, or it may also progress to blisters which may break open. These must be well cared for to prevent secondary bacterial (germ) infection, followed by scarring. Keep any open areas dry, clean, dressed, and covered with an antibacterial ointment if needed. The eyes may also get puffy. The puffiness is worst in the morning and gets better as the day progresses. This dermatitis usually heals without scarring, within 2 to 3 weeks without treatment. HOME CARE INSTRUCTIONS  Thoroughly wash with soap and water as soon as you have been exposed to poison ivy. You have about one half hour to remove the plant resin before it will cause the rash. This washing will destroy the oil or antigen on the skin that is causing, or will cause, the rash. Be sure to wash under your fingernails as any plant resin there will continue to spread the rash. Do not rub skin vigorously when washing affected area. Poison ivy cannot spread if no oil from the plant remains on your body. A rash that has progressed to weeping sores will not spread the rash unless you have not washed thoroughly. It is also important to wash any clothes you have been wearing as these may carry active allergens. The rash will return if you wear the unwashed clothing, even several days later. Avoidance of the plant in the future is the best measure. Poison ivy plant can be recognized by the number of leaves. Generally, poison ivy has three leaves with flowering branches on a single stem. Diphenhydramine may be  purchased over the counter and used as needed for itching. Do not drive with this medication if it makes you drowsy.Ask your caregiver about medication for children. SEEK MEDICAL CARE IF:  Open sores develop.  Redness spreads beyond area of rash.  You notice purulent (pus-like) discharge.  You have increased pain.  Other signs of infection develop (such as fever). Document Released: 09/14/2000 Document Revised: 12/10/2011 Document Reviewed: 02/25/2009 [facility] Patient Information 2015 [place], [place]. This information is not intended to replace advice given to you by your health care provider. Make sure you discuss any questions you have with your health care provider.

## 2015-06-13 ENCOUNTER — Ambulatory Visit: Payer: 59 | Admitting: Family Medicine

## 2015-10-07 ENCOUNTER — Ambulatory Visit (INDEPENDENT_AMBULATORY_CARE_PROVIDER_SITE_OTHER): Payer: 59 | Admitting: Family Medicine

## 2015-10-07 VITALS — BP 112/76 | HR 93 | Wt 228.0 lb

## 2015-10-07 DIAGNOSIS — E119 Type 2 diabetes mellitus without complications: Secondary | ICD-10-CM

## 2015-10-07 DIAGNOSIS — R739 Hyperglycemia, unspecified: Secondary | ICD-10-CM | POA: Diagnosis not present

## 2015-10-07 DIAGNOSIS — I10 Essential (primary) hypertension: Secondary | ICD-10-CM | POA: Diagnosis not present

## 2015-10-07 LAB — POCT GLYCOSYLATED HEMOGLOBIN (HGB A1C): Hemoglobin A1C: 6

## 2015-10-07 MED ORDER — LISINOPRIL 20 MG PO TABS: ORAL_TABLET | ORAL | Status: DC

## 2015-10-07 MED FILL — LISINOPRIL 20 MG TABLET: 20 | 90 days supply | Qty: 90 | Fill #0

## 2015-10-07 NOTE — Progress Notes (Signed)
CC: Mark Stevens is a 50 y.o. male is here for Hyperglycemia   Subjective: HPI:  Follow-up type 2 diabetes: Continues to take metformin on a daily basis.no outside blood sugars to report. He's been able to lose some pounds since I saw him last by trying to be more active during the day. He is also trying to watch what he eats with respect to carbohydrates. Denies  Vision disturbance, polyuria polyphagia or polydipsia.  Follow up essential hypertension: Taking lisinopril on a daily basis with no outside blood pressures report. Denies chest pain shortness of breath orthopnea nor peripheral edema   Review Of Systems Outlined In HPI  Past Medical History  Diagnosis Date  . Depression   . Bipolar 1 disorder ([place])     No past surgical history on file. Family History  Problem Relation Age of Onset  . Prostate cancer      uncle  . Atrial fibrillation Father   . Congestive Heart Failure      Social History   Social History  . Marital Status: Married    Spouse Name: N/A  . Number of Children: N/A  . Years of Education: N/A   Occupational History  . Not on file.   Social History Main Topics  . Smoking status: Never Smoker   . Smokeless tobacco: Not on file  . Alcohol Use: No  . Drug Use: No  . Sexual Activity:    Partners: Female   Other Topics Concern  . Not on file   Social History Narrative     Objective: BP 112/76 mmHg  Pulse 93  Wt 228 lb (103.42 kg)  General: Alert and Oriented, No Acute Distress HEENT: Pupils equal, round, reactive to light. Conjunctivae clear.  Moist mucous membranes Lungs: Clear to auscultation bilaterally, no wheezing/ronchi/rales.  Comfortable work of breathing. Good air movement. Cardiac: Regular rate and rhythm. Normal S1/S2.  No murmurs, rubs, nor gallops.   Abdomen: moderately overweight Extremities: No peripheral edema.  Strong peripheral pulses.  Mental Status: No depression, anxiety, nor agitation. Skin: Warm and  dry.  Assessment & Plan: Mark Stevens was seen today for hyperglycemia.  Diagnoses and all orders for this visit:  Hyperglycemia  Essential hypertension -     lisinopril (PRINIVIL,ZESTRIL) 20 MG tablet; One tablet by mouth daily for blood pressure control.  Type 2 diabetes mellitus without complication, without long-term current use of insulin (HCC) -     POCT HgB A1C   Type 2 diabetes: Controlled with an A1c of 6.0 therefore continue current dose of metformin. I congratulated his success with weight loss and encouraged him to continue with his endeavors. Essential hypertension: Controlled with lisinopril.  He declines flu shot today  Return in about 6 months (around 04/05/2016).

## 2015-10-09 ENCOUNTER — Encounter: Payer: Self-pay | Admitting: Family Medicine

## 2015-11-07 MED FILL — metFORMIN HCL 1000 MG TABS: 1000 | 90 days supply | Qty: 90 | Fill #2

## 2015-11-16 ENCOUNTER — Encounter: Payer: Self-pay | Admitting: Family Medicine

## 2015-11-16 ENCOUNTER — Ambulatory Visit (INDEPENDENT_AMBULATORY_CARE_PROVIDER_SITE_OTHER): Payer: 59 | Admitting: Family Medicine

## 2015-11-16 VITALS — BP 116/78 | HR 76 | Temp 98.3°F | Wt 229.0 lb

## 2015-11-16 DIAGNOSIS — J329 Chronic sinusitis, unspecified: Secondary | ICD-10-CM

## 2015-11-16 DIAGNOSIS — A499 Bacterial infection, unspecified: Secondary | ICD-10-CM | POA: Diagnosis not present

## 2015-11-16 DIAGNOSIS — B9689 Other specified bacterial agents as the cause of diseases classified elsewhere: Secondary | ICD-10-CM

## 2015-11-16 MED ORDER — AZITHROMYCIN 250 MG PO TABS: ORAL_TABLET | ORAL | Status: AC

## 2015-11-16 MED FILL — AZITHROMYCIN 250 MG TABLET: 250 | 5 days supply | Qty: 6 | Fill #0

## 2015-11-16 NOTE — Progress Notes (Signed)
CC: Mark Stevens is a 50 y.o. male is here for Sinusitis   Subjective: HPI:  About 1 week of daily facial pressure in the cheeks, postnasal drip, subjective fevers and chills that has not been getting better despite waiting. Symptoms are present all hours of the day and nothing seems to make them better or worse. Interventions have included Coricidin which does not help. He denies wheezing, cough, confusion, rash or swollen lymph nodes.   Review Of Systems Outlined In HPI  Past Medical History  Diagnosis Date  . Depression   . Bipolar 1 disorder ([place])     No past surgical history on file. Family History  Problem Relation Age of Onset  . Prostate cancer      uncle  . Atrial fibrillation Father   . Congestive Heart Failure      Social History   Social History  . Marital Status: Married    Spouse Name: N/A  . Number of Children: N/A  . Years of Education: N/A   Occupational History  . Not on file.   Social History Main Topics  . Smoking status: Never Smoker   . Smokeless tobacco: Not on file  . Alcohol Use: No  . Drug Use: No  . Sexual Activity:    Partners: Female   Other Topics Concern  . Not on file   Social History Narrative     Objective: BP 116/78 mmHg  Pulse 76  Temp(Src) 98.3 F (36.8 C) (Oral)  Wt 229 lb (103.874 kg)  General: Alert and Oriented, No Acute Distress HEENT: Pupils equal, round, reactive to light. Conjunctivae clear.  External ears unremarkable, canals clear with intact TMs with appropriate landmarks.  Middle ear appears open without effusion. Pink inferior turbinates.  Moist mucous membranes, pharynx without inflammation nor lesions other than postnasal drip.  Neck supple without palpable lymphadenopathy nor abnormal masses. Lungs: Clear to auscultation bilaterally, no wheezing/ronchi/rales.  Comfortable work of breathing. Good air movement. Extremities: No peripheral edema.  Strong peripheral pulses.  Mental Status: No depression,  anxiety, nor agitation. Skin: Warm and dry.  Assessment & Plan: Mark Stevens was seen today for sinusitis.  Diagnoses and all orders for this visit:  Bacterial sinusitis -     azithromycin (ZITHROMAX) 250 MG tablet; Take two tabs at once on day 1, then one tab daily on days 2-5.   Start azithromycin, call if no better by Friday and would switch to levofloxacin.  Return if symptoms worsen or fail to improve.

## 2015-12-02 ENCOUNTER — Telehealth: Payer: Self-pay

## 2015-12-02 DIAGNOSIS — F319 Bipolar disorder, unspecified: Secondary | ICD-10-CM | POA: Diagnosis not present

## 2015-12-02 MED ORDER — AMOXICILLIN-POT CLAVULANATE 500-125 MG PO TABS: ORAL_TABLET | ORAL | Status: AC

## 2015-12-02 MED FILL — DIVALPROEX SOD ER 500 MG TA: 500 | 90 days supply | Qty: 180 | Fill #0

## 2015-12-02 MED FILL — AMOX-CLAV 500-125 MG TABLET: 500-125 | 10 days supply | Qty: 30 | Fill #0

## 2015-12-02 MED FILL — BUPROPION HCL XL 300 MG TAB: 300 | 90 days supply | Qty: 90 | Fill #0

## 2015-12-02 NOTE — Telephone Encounter (Signed)
Pt was seen for sinusitis on 11/16/15.  He stated that his Sx are back and is wanting another antibiotic. Please advise.

## 2015-12-02 NOTE — Telephone Encounter (Signed)
Antibiotic Rx of augmentin has been sent to the med center in [place].

## 2015-12-02 NOTE — Telephone Encounter (Signed)
Patient advised of recommendations.  

## 2015-12-13 DIAGNOSIS — G4733 Obstructive sleep apnea (adult) (pediatric): Secondary | ICD-10-CM | POA: Diagnosis not present

## 2015-12-19 ENCOUNTER — Ambulatory Visit: Payer: 59 | Admitting: Family Medicine

## 2016-01-18 ENCOUNTER — Ambulatory Visit (INDEPENDENT_AMBULATORY_CARE_PROVIDER_SITE_OTHER): Payer: 59 | Admitting: Family Medicine

## 2016-01-18 ENCOUNTER — Encounter: Payer: Self-pay | Admitting: Family Medicine

## 2016-01-18 VITALS — BP 122/80 | HR 88 | Wt 237.0 lb

## 2016-01-18 DIAGNOSIS — R51 Headache: Secondary | ICD-10-CM | POA: Diagnosis not present

## 2016-01-18 DIAGNOSIS — R519 Headache, unspecified: Secondary | ICD-10-CM

## 2016-01-18 DIAGNOSIS — I10 Essential (primary) hypertension: Secondary | ICD-10-CM | POA: Diagnosis not present

## 2016-01-18 DIAGNOSIS — B360 Pityriasis versicolor: Secondary | ICD-10-CM | POA: Diagnosis not present

## 2016-01-18 MED ORDER — LISINOPRIL 20 MG PO TABS: ORAL_TABLET | ORAL | Status: DC

## 2016-01-18 MED ORDER — FLUCONAZOLE 150 MG PO TABS: ORAL_TABLET | ORAL | Status: DC

## 2016-01-18 MED ORDER — METFORMIN HCL 1000 MG PO TABS: ORAL_TABLET | ORAL | Status: DC

## 2016-01-18 MED FILL — LISINOPRIL 20 MG TABLET: 20 | 90 days supply | Qty: 90 | Fill #0

## 2016-01-18 MED FILL — metFORMIN HCL 1000 MG TABS: 1000 | 90 days supply | Qty: 90 | Fill #0

## 2016-01-18 MED FILL — FLUCONAZOLE 150 MG TABLET: 150 | 21 days supply | Qty: 6 | Fill #0

## 2016-01-18 NOTE — Progress Notes (Signed)
CC: Mark Stevens is a 50 y.o. male is here for Headache and skin lesion   Subjective: HPI:  Follow-up essential hypertension: Requesting refills on lisinopril. No outside blood pressures report. Denies chest pain shortness of breath orthopnea or peripheral edema.  4-5 times a week he'll get a headache. So localized in the back of the head and is mild in severity. It improved with over-the-counter ibuprofen or Tylenol. Nothing seems to make it worse but he does notice that it comes on if he spends more than a few hours staring at his computer. Been going on for a few months and does not feel like his for migraines. He denies fevers, chills, nasal congestion, cough, neck stiffness, overlying skin changes.  His wife is noticing some skin changes on his back. He tells me doesn't bother him 1 bit. She believes it is spreading and getting larger. He denies any itching or skin changes elsewhere.   Review Of Systems Outlined In HPI  Past Medical History  Diagnosis Date  . Depression   . Bipolar 1 disorder ([place])     No past surgical history on file. Family History  Problem Relation Age of Onset  . Prostate cancer      uncle  . Atrial fibrillation Father   . Congestive Heart Failure      Social History   Social History  . Marital Status: Married    Spouse Name: N/A  . Number of Children: N/A  . Years of Education: N/A   Occupational History  . Not on file.   Social History Main Topics  . Smoking status: Never Smoker   . Smokeless tobacco: Not on file  . Alcohol Use: No  . Drug Use: No  . Sexual Activity:    Partners: Female   Other Topics Concern  . Not on file   Social History Narrative     Objective: BP 122/80 mmHg  Pulse 88  Wt 237 lb (107.502 kg)  General: Alert and Oriented, No Acute Distress HEENT: Pupils equal, round, reactive to light. Conjunctivae clear.  Moist mucous membranes Lungs: Clear to auscultation bilaterally, no wheezing/ronchi/rales.   Comfortable work of breathing. Good air movement. Cardiac: Regular rate and rhythm. Normal S1/S2.  No murmurs, rubs, nor gallops.   Extremities: No peripheral edema.  Strong peripheral pulses.  Mental Status: No depression, anxiety, nor agitation. Skin: Warm and dry. Slightly erythematous and hyperpigmented patches ranging from 2 submitted in diameter to 6 and a meters in diameter affecting the back worse closer to the axilla  Assessment & Plan: Mark Stevens was seen today for headache and skin lesion.  Diagnoses and all orders for this visit:  Essential hypertension -     lisinopril (PRINIVIL,ZESTRIL) 20 MG tablet; One tablet by mouth daily for blood pressure control.  Nonintractable headache, unspecified chronicity pattern, unspecified headache type  Tinea versicolor -     fluconazole (DIFLUCAN) 150 MG tablet; Two tabs at once taken on a weekly basis for a total of three weeks.  Other orders -     metFORMIN (GLUCOPHAGE) 1000 MG tablet; One tablet by mouth every evening for blood sugar control.   Essential hypertension: Controlled continue lisinopril Headaches: Likely due to visual strain, discussed limiting his hours behind a computer and to start a daily magnesium supplement any dose between 300-400 mg daily. Tinea versicolor: Start 3 week regimen of Diflucan.  Return if symptoms worsen or fail to improve.

## 2016-01-23 ENCOUNTER — Ambulatory Visit (INDEPENDENT_AMBULATORY_CARE_PROVIDER_SITE_OTHER): Payer: 59 | Admitting: Family Medicine

## 2016-01-23 ENCOUNTER — Encounter: Payer: Self-pay | Admitting: Family Medicine

## 2016-01-23 VITALS — BP 121/79 | HR 77 | Wt 234.0 lb

## 2016-01-23 DIAGNOSIS — Z23 Encounter for immunization: Secondary | ICD-10-CM

## 2016-01-23 DIAGNOSIS — Z0000 Encounter for general adult medical examination without abnormal findings: Secondary | ICD-10-CM | POA: Diagnosis not present

## 2016-01-23 LAB — COMPLETE METABOLIC PANEL WITH GFR
ALT: 18 U/L (ref 9–46)
AST: 15 U/L (ref 10–35)
Albumin: 4.4 g/dL (ref 3.6–5.1)
Alkaline Phosphatase: 50 U/L (ref 40–115)
BUN: 14 mg/dL (ref 7–25)
CO2: 27 mmol/L (ref 20–31)
Calcium: 9.2 mg/dL (ref 8.6–10.3)
Chloride: 101 mmol/L (ref 98–110)
Creat: 1.21 mg/dL (ref 0.70–1.33)
GFR, Est African American: 80 mL/min (ref >=60)
GFR, Est Non African American: 69 mL/min (ref >=60)
Glucose, Bld: 111 mg/dL — ABNORMAL HIGH (ref 65–99)
Potassium: 4.5 mmol/L (ref 3.5–5.3)
Sodium: 137 mmol/L (ref 135–146)
Total Bilirubin: 0.5 mg/dL (ref 0.2–1.2)
Total Protein: 6.6 g/dL (ref 6.1–8.1)

## 2016-01-23 LAB — CBC
HCT: 46.2 % (ref 38.5–50.0)
Hemoglobin: 16.1 g/dL (ref 13.2–17.1)
MCH: 31.2 pg (ref 27.0–33.0)
MCHC: 34.8 g/dL (ref 32.0–36.0)
MCV: 89.5 fL (ref 80.0–100.0)
MPV: 9.7 fL (ref 7.5–12.5)
Platelets: 207 10*3/uL (ref 140–400)
RBC: 5.16 MIL/uL (ref 4.20–5.80)
RDW: 13.7 % (ref 11.0–15.0)
WBC: 5.8 10*3/uL (ref 3.8–10.8)

## 2016-01-23 LAB — LIPID PANEL
Cholesterol: 172 mg/dL (ref 125–200)
HDL: 44 mg/dL (ref >=40)
LDL Cholesterol: 80 mg/dL (ref <130)
Total CHOL/HDL Ratio: 3.9 Ratio (ref <=5.0)
Triglycerides: 240 mg/dL — ABNORMAL HIGH (ref <150)
VLDL: 48 mg/dL — ABNORMAL HIGH (ref <30)

## 2016-01-23 NOTE — Addendum Note (Signed)
Addended by: Delrae Alfred on: 01/23/2016 10:57 AM   Modules accepted: Orders, SmartSet

## 2016-01-23 NOTE — Progress Notes (Signed)
CC: Mark Stevens is a 50 y.o. male is here for Annual Exam   Subjective: HPI:  Colonoscopy: Declines colonoscopy but agreeable to cologuard, orders placed. Prostate: Discussed screening risks/beneifts with patient today, due for PSA Influenza Vaccine: Out of season Pneumovax: No indication  Td/Tdap: will receive booster today Zoster: (Start 50 yo)  Requesting complete physical exam with no complaints, headaches have improved since I saw him last   Review Of Systems Outlined In HPI  Past Medical History  Diagnosis Date  . Depression   . Bipolar 1 disorder (Mark Stevens)     No past surgical history on file. Family History  Problem Relation Age of Onset  . Prostate cancer      Mark Stevens  . Atrial fibrillation Mark Stevens   . Congestive Heart Failure      Social History   Social History  . Marital Status: Married    Spouse Name: N/A  . Number of Children: N/A  . Years of Education: N/A   Occupational History  . Not on file.   Social History Main Topics  . Smoking status: Never Smoker   . Smokeless tobacco: Not on file  . Alcohol Use: No  . Drug Use: No  . Sexual Activity:    Partners: Female   Other Topics Concern  . Not on file   Social History Narrative     Objective: BP 121/79 mmHg  Pulse 77  Wt 234 lb (106.142 kg)  General: No Acute Distress HEENT: Atraumatic, normocephalic, conjunctivae normal without scleral icterus.  No nasal discharge, hearing grossly intact, TMs with good landmarks bilaterally with no middle ear abnormalities, posterior pharynx clear without oral lesions. Neck: Supple, trachea midline, no cervical nor supraclavicular adenopathy. Pulmonary: Clear to auscultation bilaterally without wheezing, rhonchi, nor rales. Cardiac: Regular rate and rhythm.  No murmurs, rubs, nor gallops. No peripheral edema.  2+ peripheral pulses bilaterally. Abdomen: Bowel sounds normal.  No masses.  Non-tender without rebound.  Negative Murphy's sign. MSK: Grossly  intact, no signs of weakness.  Full strength throughout upper and lower extremities.  Full ROM in upper and lower extremities.  No midline spinal tenderness. Neuro: Gait unremarkable, CN II-XII grossly intact.  C5-C6 Reflex 2/4 Bilaterally, L4 Reflex 2/4 Bilaterally.  Cerebellar function intact. Skin: No rashes. Psych: Alert and oriented to person/place/time.  Thought process normal. No anxiety/depression.  Assessment & Plan: [name] was seen today for annual exam.  Diagnoses and all orders for this visit:  Annual physical exam -     COMPLETE METABOLIC PANEL WITH GFR -     CBC -     PSA -     Lipid panel   Healthy lifestyle interventions including but not limited to regular exercise, a healthy low fat diet, moderation of salt intake, the dangers of tobacco/alcohol/recreational drug use, nutrition supplementation, and accident avoidance were discussed with the patient and a handout was provided for future reference.  Return in about 3 months (around 04/23/2016) for BP.

## 2016-01-24 ENCOUNTER — Telehealth: Payer: Self-pay | Admitting: Family Medicine

## 2016-01-24 LAB — PSA: PSA: 0.26 ng/mL (ref <=4.00)

## 2016-01-24 MED ORDER — ATORVASTATIN CALCIUM 10 MG PO TABS: 10.0000 mg | ORAL_TABLET | Freq: Every day | ORAL | Status: DC

## 2016-01-24 MED FILL — ATORVASTATIN 10 MG TABLET: 10 | 90 days supply | Qty: 90 | Fill #0

## 2016-01-24 NOTE — Telephone Encounter (Signed)
Pt.notified

## 2016-01-24 NOTE — Telephone Encounter (Signed)
Will you please let patient know that his PSA prostate test, blood cell counts, liver function, and kidney function were normal.  His cholesterol was elevated and there is strong evidence that people with diabetes who take a daily cholesterol medication called atorvastatin helps significantly reduce chances of having a heart attack. I'll send this new Rx to the med center in [place].

## 2016-02-13 DIAGNOSIS — Z1212 Encounter for screening for malignant neoplasm of rectum: Secondary | ICD-10-CM | POA: Diagnosis not present

## 2016-02-13 DIAGNOSIS — Z1211 Encounter for screening for malignant neoplasm of colon: Secondary | ICD-10-CM | POA: Diagnosis not present

## 2016-02-14 LAB — COLOGUARD: Cologuard: NEGATIVE

## 2016-02-23 LAB — COLOGUARD: Cologuard: NEGATIVE

## 2016-03-01 ENCOUNTER — Telehealth: Payer: Self-pay | Admitting: Family Medicine

## 2016-03-01 NOTE — Telephone Encounter (Signed)
Will you please let patient know that his cologuard test was normal and reassuring.  I'd recommend we re-do this in three years.

## 2016-03-01 NOTE — Telephone Encounter (Signed)
Pt. Notified.

## 2016-03-02 DIAGNOSIS — F319 Bipolar disorder, unspecified: Secondary | ICD-10-CM | POA: Diagnosis not present

## 2016-03-02 MED FILL — BUPROPION HCL XL 300 MG TAB: 300 | 90 days supply | Qty: 90 | Fill #0

## 2016-03-02 MED FILL — DIVALPROEX SOD ER 500 MG TA: 500 | 90 days supply | Qty: 180 | Fill #0

## 2016-03-06 ENCOUNTER — Encounter: Payer: Self-pay | Admitting: Family Medicine

## 2016-03-21 ENCOUNTER — Ambulatory Visit (INDEPENDENT_AMBULATORY_CARE_PROVIDER_SITE_OTHER): Payer: 59 | Admitting: Physician Assistant

## 2016-03-21 ENCOUNTER — Encounter: Payer: Self-pay | Admitting: Physician Assistant

## 2016-03-21 VITALS — BP 123/73 | HR 84 | Ht 70.0 in | Wt 240.0 lb

## 2016-03-21 DIAGNOSIS — H9201 Otalgia, right ear: Secondary | ICD-10-CM | POA: Diagnosis not present

## 2016-03-21 MED ORDER — FLUTICASONE PROPIONATE 50 MCG/ACT NA SUSP: 2.0000 | Freq: Every day | NASAL | Status: DC

## 2016-03-21 MED ORDER — PSEUDOEPHEDRINE HCL ER 120 MG PO TB12: 120.0000 mg | ORAL_TABLET | Freq: Every day | ORAL | Status: DC | PRN

## 2016-03-21 MED FILL — SM SINUS 12HR 120 MG CAPLET: 120 MG | 30 days supply | Qty: 30 | Fill #0

## 2016-03-21 MED FILL — FLUTICASONE PROP 50 MCG SPR: 50 | 30 days supply | Qty: 16 | Fill #0

## 2016-03-21 NOTE — Progress Notes (Signed)
   Subjective:    Patient ID: Mark Stevens, male    DOB: July 11, 1966, 50 y.o.   MRN: [id number]  HPI Patient is a 50 year old male who presents to the clinic with right ear pain off and on for about 2-3 weeks. Denies any fever, chills, n/v/d, sinus pressure, ST, cough, SOB or wheezing. He has not tried anything to make better. Nothing seems to make worse. At times his ear will hurt less and other times more. He denies any travel or flying. He does not have a history of allergies and not on any medications.    Review of Systems  All other systems reviewed and are negative.      Objective:   Physical Exam  Constitutional: He is oriented to person, place, and time. He appears well-developed and well-nourished.  HENT:  Head: Normocephalic and atraumatic.  Right Ear: External ear normal.  Left Ear: External ear normal.  Nose: Nose normal.  Mouth/Throat: Oropharynx is clear and moist. No oropharyngeal exudate.  No tenderness over external right ear. Erythema in canal. TM clear. No blood pus or discharge.    Eyes: Conjunctivae are normal. Right eye exhibits no discharge. Left eye exhibits no discharge.  Neck: Normal range of motion. Neck supple.  Cardiovascular: Normal rate, regular rhythm and normal heart sounds.   Pulmonary/Chest: Effort normal and breath sounds normal. He has no wheezes.  Lymphadenopathy:    He has no cervical adenopathy.  Neurological: He is alert and oriented to person, place, and time.  Psychiatric: He has a normal mood and affect. His behavior is normal.          Assessment & Plan:  Right ear pain- reassured patient no infection. I suspect some ETD. Pt has adverse reactions to prednisone. Will start with sudafed once a day and flonase 2 sprays each nostril. Follow up as needed.

## 2016-03-21 NOTE — Patient Instructions (Signed)
Earache An earache, also called otalgia, can be caused by many things. Pain from an earache can be sharp, dull, or burning. The pain may be temporary or constant. Earaches can be caused by problems with the ear, such as infection in either the middle ear or the ear canal, injury, impacted ear wax, middle ear pressure, or a foreign body in the ear. Ear pain can also result from problems in other areas. This is called referred pain. For example, pain can come from a sore throat, a tooth infection, or problems with the jaw or the joint between the jaw and the skull (temporomandibular joint, or TMJ). The cause of an earache is not always easy to identify. Watchful waiting may be appropriate for some earaches until a clear cause of the pain can be found. HOME CARE INSTRUCTIONS Watch your condition for any changes. The following actions may help to lessen any discomfort that you are feeling:  Take medicines only as directed by your health care provider. This includes ear drops.  Apply ice to your outer ear to help reduce pain.  Put ice in a plastic bag.  Place a towel between your skin and the bag.  Leave the ice on for 20 minutes, 2-3 times per day.  Do not put anything in your ear other than medicine that is prescribed by your health care provider.  Try resting in an upright position instead of lying down. This may help to reduce pressure in the middle ear and relieve pain.  Chew gum if it helps to relieve your ear pain.  Control any allergies that you have.  Keep all follow-up visits as directed by your health care provider. This is important. SEEK MEDICAL CARE IF:  Your pain does not improve within 2 days.  You have a fever.  You have new or worsening symptoms. SEEK IMMEDIATE MEDICAL CARE IF:  You have a severe headache.  You have a stiff neck.  You have difficulty swallowing.  You have redness or swelling behind your ear.  You have drainage from your ear.  You have hearing  loss.  You feel dizzy.   This information is not intended to replace advice given to you by your health care provider. Make sure you discuss any questions you have with your health care provider.   Document Released: 05/04/2004 Document Revised: 10/08/2014 Document Reviewed: 04/18/2014 Elsevier Interactive Patient Education 2016 [employer]  

## 2016-03-22 ENCOUNTER — Ambulatory Visit: Payer: 59 | Admitting: Family Medicine

## 2016-04-09 ENCOUNTER — Encounter: Payer: Self-pay | Admitting: Family Medicine

## 2016-04-09 ENCOUNTER — Ambulatory Visit (INDEPENDENT_AMBULATORY_CARE_PROVIDER_SITE_OTHER): Payer: 59 | Admitting: Family Medicine

## 2016-04-09 VITALS — BP 125/80 | HR 80 | Wt 236.0 lb

## 2016-04-09 DIAGNOSIS — R739 Hyperglycemia, unspecified: Secondary | ICD-10-CM

## 2016-04-09 DIAGNOSIS — E119 Type 2 diabetes mellitus without complications: Secondary | ICD-10-CM | POA: Diagnosis not present

## 2016-04-09 DIAGNOSIS — I10 Essential (primary) hypertension: Secondary | ICD-10-CM

## 2016-04-09 MED ORDER — METFORMIN HCL 1000 MG PO TABS: ORAL_TABLET | ORAL | Status: DC

## 2016-04-09 MED ORDER — LISINOPRIL 20 MG PO TABS: ORAL_TABLET | ORAL | Status: DC

## 2016-04-09 MED FILL — metFORMIN HCL 1000 MG TABS: 1000 | 90 days supply | Qty: 90 | Fill #0

## 2016-04-09 MED FILL — LISINOPRIL 20 MG TABLET: 20 | 90 days supply | Qty: 90 | Fill #0

## 2016-04-09 NOTE — Progress Notes (Signed)
CC: Mark Stevens is a 50 y.o. male is here for Hypertension   Subjective: HPI:  Follow-up type 2 diabetes: Currently taking metformin on daily basis. He denies any known side effects or intolerance. No outside blood sugars to report. Denies polyuria polyphagia polydipsia or poor vision.    Follow-up essential hypertension: Taking lisinopril on a daily basis with 100% compliance. No outside blood pressures report. Denies any chest pain shortness of breath orthopnea nor peripheral edema.   Review Of Systems Outlined In HPI  Past Medical History  Diagnosis Date  . Depression   . Bipolar 1 disorder ([place])     No past surgical history on file. Family History  Problem Relation Age of Onset  . Prostate cancer      uncle  . Atrial fibrillation Father   . Congestive Heart Failure      Social History   Social History  . Marital Status: Married    Spouse Name: N/A  . Number of Children: N/A  . Years of Education: N/A   Occupational History  . Not on file.   Social History Main Topics  . Smoking status: Never Smoker   . Smokeless tobacco: Not on file  . Alcohol Use: No  . Drug Use: No  . Sexual Activity:    Partners: Female   Other Topics Concern  . Not on file   Social History Narrative     Objective: BP 125/80 mmHg  Pulse 80  Wt 236 lb (107.049 kg)  General: Alert and Oriented, No Acute Distress HEENT: Pupils equal, round, reactive to light. Conjunctivae clear.  External ears unremarkable, canals clear with intact TMs with appropriate landmarks.  Middle ear appears open without effusion. Pink inferior turbinates.  Moist mucous membranes, pharynx without inflammation nor lesions.  Neck supple without palpable lymphadenopathy nor abnormal masses. Lungs: Clear to auscultation bilaterally, no wheezing/ronchi/rales.  Comfortable work of breathing. Good air movement. Cardiac: Regular rate and rhythm. Normal S1/S2.  No murmurs, rubs, nor gallops.   Abdomen: Normal bowel  sounds, soft and non tender without palpable masses. Extremities: No peripheral edema.  Strong peripheral pulses.  Mental Status: No depression, anxiety, nor agitation. Skin: Warm and dry.  Assessment & Plan: Mark Stevens was seen today for hypertension.  Diagnoses and all orders for this visit:  Type 2 diabetes mellitus without complication, without long-term current use of insulin ([facility]) -     POCT HgB A1C  Essential hypertension -     lisinopril (PRINIVIL,ZESTRIL) 20 MG tablet; One tablet by mouth daily for blood pressure control.  Hyperglycemia  Other orders -     metFORMIN (GLUCOPHAGE) 1000 MG tablet; One tablet by mouth every evening for blood sugar control.   Type 2 diabetes: A1c of 6.1, controlled with current metformin area continue 1000 mg daily. Essential hypertension: Controlled with lisinopril continue current dose of 20 mg daily.  Discussed with this patient that I will be resigning from my position here with [facility] in September in order to stay with my family who will be moving to [facility]. I let him know about the providers that are still accepting patients and I feel that he'll be under great care if he stays here with [facility].    Return in about 6 months (around 10/10/2016), or if symptoms worsen or fail to improve.

## 2016-04-10 ENCOUNTER — Encounter: Payer: Self-pay | Admitting: Family Medicine

## 2016-04-10 LAB — POCT GLYCOSYLATED HEMOGLOBIN (HGB A1C): Hemoglobin A1C: 6.1

## 2016-04-20 ENCOUNTER — Ambulatory Visit (INDEPENDENT_AMBULATORY_CARE_PROVIDER_SITE_OTHER): Payer: 59 | Admitting: Family Medicine

## 2016-04-20 ENCOUNTER — Other Ambulatory Visit: Payer: Self-pay | Admitting: Family Medicine

## 2016-04-20 ENCOUNTER — Ambulatory Visit (HOSPITAL_BASED_OUTPATIENT_CLINIC_OR_DEPARTMENT_OTHER)
Admission: RE | Admit: 2016-04-20 | Discharge: 2016-04-20 | Disposition: A | Discharge status: Home / Self Care | Payer: 59 | Source: Ambulatory Visit | Attending: Family Medicine | Admitting: Family Medicine

## 2016-04-20 ENCOUNTER — Encounter: Payer: Self-pay | Admitting: Family Medicine

## 2016-04-20 VITALS — BP 120/79 | HR 80 | Wt 238.0 lb

## 2016-04-20 DIAGNOSIS — E119 Type 2 diabetes mellitus without complications: Secondary | ICD-10-CM | POA: Diagnosis not present

## 2016-04-20 DIAGNOSIS — I10 Essential (primary) hypertension: Secondary | ICD-10-CM

## 2016-04-20 DIAGNOSIS — R51 Headache: Secondary | ICD-10-CM

## 2016-04-20 DIAGNOSIS — R519 Headache, unspecified: Secondary | ICD-10-CM

## 2016-04-20 LAB — BASIC METABOLIC PANEL WITH GFR
BUN: 13 mg/dL (ref 7–25)
CO2: 25 mmol/L (ref 20–31)
Calcium: 9.3 mg/dL (ref 8.6–10.3)
Chloride: 100 mmol/L (ref 98–110)
Creat: 1.16 mg/dL (ref 0.70–1.33)
GFR, Est African American: 84 mL/min (ref >=60)
GFR, Est Non African American: 73 mL/min (ref >=60)
Glucose, Bld: 133 mg/dL — ABNORMAL HIGH (ref 65–99)
Potassium: 4 mmol/L (ref 3.5–5.3)
Sodium: 137 mmol/L (ref 135–146)

## 2016-04-20 MED ORDER — IOPAMIDOL (ISOVUE-370) INJECTION 76%
100.0000 mL | Freq: Once | INTRAVENOUS | Status: AC | PRN
  Administered 2016-04-20: 100 mL via INTRAVENOUS

## 2016-04-20 NOTE — Progress Notes (Signed)
CC: Mark Stevens is a 50 y.o. male is here for Headache   Subjective: HPI:  On Wednesday of this week was sitting at his desk he had a sudden onset of a flushed hot feeling along with a pulsatile headache. He has had migraines in the past but he tells me this one felt nothing like a migraine. It is localized on the back of his head and was pulsatile. He states that this is the worst headache of his life. It subsided somewhat later that day without any particular intervention. He has a lingering headache that's in the same location that fluctuates between a dull headache and a pulsatile headache. He denies any other motor or sensory disturbances. Denies any confusion or disorientation. No coordination difficulties. Denies any facial pressure, nasal congestion, sore throat, nor scalp complaints. Denies any recent remote trauma. No urinary incontinence.   Review Of Systems Outlined In HPI  Past Medical History  Diagnosis Date  . Depression   . Bipolar 1 disorder ([place])     No past surgical history on file. Family History  Problem Relation Age of Onset  . Prostate cancer      uncle  . Atrial fibrillation Father   . Congestive Heart Failure      Social History   Social History  . Marital Status: Married    Spouse Name: N/A  . Number of Children: N/A  . Years of Education: N/A   Occupational History  . Not on file.   Social History Main Topics  . Smoking status: Never Smoker   . Smokeless tobacco: Not on file  . Alcohol Use: No  . Drug Use: No  . Sexual Activity:    Partners: Female   Other Topics Concern  . Not on file   Social History Narrative     Objective: BP 120/79 mmHg  Pulse 80  Wt 238 lb (107.956 kg)  General: Alert and Oriented, No Acute Distress HEENT: Pupils equal, round, reactive to light. Conjunctivae clear.  External ears unremarkable, canals clear with intact TMs with appropriate landmarks.  Middle ear appears open without effusion. Pink inferior  turbinates.  Moist mucous membranes, pharynx without inflammation nor lesions.  Neck supple without palpable lymphadenopathy nor abnormal masses. Lungs: Clear to auscultation bilaterally, no wheezing/ronchi/rales.  Comfortable work of breathing. Good air movement. Cardiac: Regular rate and rhythm. Normal S1/S2.  No murmurs, rubs, nor gallops.  No carotid bruit Neuro: CN II-XII grossly intact, full strength/rom of all four extremities, C5/L4/S1 DTRs 2/4 bilaterally, gait normal, rapid alternating movements normal, heel-shin test normal, Rhomberg normal. Extremities: No peripheral edema.  Strong peripheral pulses.  Mental Status: No depression, anxiety, nor agitation. Skin: Warm and dry.  Assessment & Plan: Mark Stevens was seen today for headache.  Diagnoses and all orders for this visit:  Worst headache of life -     CT ANGIO HEAD W OR WO CONTRAST  Headache: I recommended that we get a CT angiogram of the head to rule out the possibility of an aneurysm has formed and caused his headache. He is going to get a CT scan later today and I will call him personally tonight once results are available. Signs and symptoms requring emergent/urgent reevaluation were discussed with the patient.   Return if symptoms worsen or fail to improve.

## 2016-04-30 MED FILL — ATORVASTATIN 10 MG TABLET: 10 | 90 days supply | Qty: 90 | Fill #1

## 2016-05-25 DIAGNOSIS — F319 Bipolar disorder, unspecified: Secondary | ICD-10-CM | POA: Diagnosis not present

## 2016-06-08 MED FILL — BUPROPION HCL XL 300 MG TAB: 300 | 90 days supply | Qty: 90 | Fill #0

## 2016-06-08 MED FILL — DIVALPROEX SOD ER 500 MG TA: 500 | 90 days supply | Qty: 180 | Fill #0

## 2016-06-13 ENCOUNTER — Encounter: Payer: Self-pay | Admitting: Physician Assistant

## 2016-06-13 ENCOUNTER — Ambulatory Visit (INDEPENDENT_AMBULATORY_CARE_PROVIDER_SITE_OTHER): Payer: 59 | Admitting: Physician Assistant

## 2016-06-13 VITALS — BP 110/63 | HR 82 | Ht 70.0 in | Wt 231.0 lb

## 2016-06-13 DIAGNOSIS — H109 Unspecified conjunctivitis: Secondary | ICD-10-CM

## 2016-06-13 MED ORDER — POLYMYXIN B-TRIMETHOPRIM 10000-0.1 UNIT/ML-% OP SOLN: 1.0000 [drp] | Freq: Four times a day (QID) | OPHTHALMIC | 0 refills | Status: DC

## 2016-06-13 MED FILL — POLYMYXIN B/TMP EYE DROPS: 10000-0.1 | 50 days supply | Qty: 10 | Fill #0

## 2016-06-13 NOTE — Patient Instructions (Signed)

## 2016-06-13 NOTE — Progress Notes (Addendum)
Subjective:     Patient ID: Mark Stevens, male   DOB: 1965/10/11, 50 y.o.   MRN: [id number]  HPI Patient is a 50 y.o. Caucasian male presenting with complaints of left eye redness, itching, and discharge. Patient reports that his wife is a Marine scientist and is concerned that he may have pink eye. The patient states that numerous children at his church have had pink eye recently. The patient describes his symptoms as itching and that it feels like there is something in his eye. Patient states that he woke up today and there was dark yellow discharge/crust on his eye. The patients states that it is very red and puffy. He notes that he has never had this before and has been taking over-the-counter visine with no relief. The patient has been using warm towel compresses with some symptom relief and denies any contact use, visual changes, fever, or history of pink eye. The patient admits to currently having some environmental allergies with some postnasal drip.   Review of Systems  Constitutional: Negative for activity change, appetite change, chills, diaphoresis, fatigue, fever and unexpected weight change.  HENT: Positive for postnasal drip. Negative for congestion, ear discharge, ear pain, rhinorrhea and sore throat.   Eyes: Positive for discharge, redness and itching. Negative for photophobia, pain and visual disturbance.  Respiratory: Negative for cough, chest tightness, shortness of breath and wheezing.   Cardiovascular: Negative for chest pain, palpitations and leg swelling.  Gastrointestinal: Negative for abdominal pain, blood in stool, constipation, diarrhea, nausea and vomiting.  Genitourinary: Negative for difficulty urinating, dysuria, flank pain, frequency, hematuria and urgency.  Musculoskeletal: Negative for arthralgias and myalgias.  Allergic/Immunologic: Positive for environmental allergies.  Neurological: Negative for dizziness, weakness, light-headedness, numbness and headaches.        Objective:   Physical Exam  Constitutional: He is oriented to person, place, and time. He appears well-developed and well-nourished. No distress.  HENT:  Head: Normocephalic and atraumatic.  Right Ear: External ear normal.  Nose: Nose normal.  Mouth/Throat: Oropharynx is clear and moist. No oropharyngeal exudate.  Eyes: EOM are normal. Pupils are equal, round, and reactive to light. Right eye exhibits no chemosis, no discharge, no exudate and no hordeolum. No foreign body present in the right eye. Left eye exhibits discharge. Left eye exhibits no chemosis, no exudate and no hordeolum. No foreign body present in the left eye. Right conjunctiva is not injected. Right conjunctiva has no hemorrhage. Left conjunctiva is injected. Left conjunctiva has no hemorrhage. No scleral icterus.    Neck: Normal range of motion. Neck supple. No JVD present. No tracheal deviation present. No thyromegaly present.  Cardiovascular: Normal rate, regular rhythm, normal heart sounds and intact distal pulses.  Exam reveals no gallop and no friction rub.   No murmur heard. Pulmonary/Chest: Effort normal and breath sounds normal. No stridor. No respiratory distress. He has no wheezes. He has no rales. He exhibits no tenderness.  Lymphadenopathy:    He has no cervical adenopathy.  Neurological: He is alert and oriented to person, place, and time. He has normal reflexes. He displays normal reflexes. No cranial nerve deficit. He exhibits normal muscle tone. Coordination normal.  Skin: Skin is warm and dry. No rash noted. He is not diaphoretic. No erythema. No pallor.  Psychiatric: He has a normal mood and affect. His behavior is normal. Judgment and thought content normal.      Assessment:     Diagnoses and all orders for this visit:  Conjunctivitis of left  eye  Other orders -     trimethoprim-polymyxin b (POLYTRIM) ophthalmic solution; Place 1 drop into the left eye every 6 (six) hours. For 7 days.      Plan:      1. Conjunctiva, left eye - Discussed with patient that his symptoms are most likely secondary to a bacterial conjunctivitis. Patient was prescribed polytrim opthalmic solution 1 drop into his left eye every 6 hours for 7 days. Patient was educated on proper optic hygiene including regularly washing his pillow case, towels, regular hand washing, and keeping the eye clean and dry. Patient to follow-up if symptoms do not improve or worsen.

## 2016-07-09 ENCOUNTER — Ambulatory Visit (INDEPENDENT_AMBULATORY_CARE_PROVIDER_SITE_OTHER): Payer: Self-pay | Admitting: Orthopedic Surgery

## 2016-08-08 MED FILL — metFORMIN HCL 1000 MG TABS: 1000 | 90 days supply | Qty: 90 | Fill #1

## 2016-08-08 MED FILL — LISINOPRIL 20 MG TABLET: 20 | 90 days supply | Qty: 90 | Fill #1

## 2016-08-08 MED FILL — ATORVASTATIN 10 MG TABLET: 10 | 90 days supply | Qty: 90 | Fill #2

## 2016-08-17 DIAGNOSIS — F319 Bipolar disorder, unspecified: Secondary | ICD-10-CM | POA: Diagnosis not present

## 2016-08-17 MED FILL — BUPROPION HCL XL 300 MG TAB: 300 | 90 days supply | Qty: 90 | Fill #0 | Status: TO

## 2016-08-17 MED FILL — DIVALPROEX SOD ER 500 MG TA: 500 | 90 days supply | Qty: 180 | Fill #0

## 2016-10-03 ENCOUNTER — Encounter: Payer: Self-pay | Admitting: Physician Assistant

## 2016-10-03 ENCOUNTER — Ambulatory Visit (INDEPENDENT_AMBULATORY_CARE_PROVIDER_SITE_OTHER): Payer: 59 | Admitting: Physician Assistant

## 2016-10-03 VITALS — BP 125/80 | HR 92 | Ht 70.0 in | Wt 232.0 lb

## 2016-10-03 DIAGNOSIS — E119 Type 2 diabetes mellitus without complications: Secondary | ICD-10-CM

## 2016-10-03 DIAGNOSIS — I10 Essential (primary) hypertension: Secondary | ICD-10-CM | POA: Diagnosis not present

## 2016-10-03 LAB — POCT GLYCOSYLATED HEMOGLOBIN (HGB A1C): Hemoglobin A1C: 6.1

## 2016-10-03 MED ORDER — METFORMIN HCL 1000 MG PO TABS: ORAL_TABLET | ORAL | 2 refills | Status: DC

## 2016-10-03 MED ORDER — LISINOPRIL 20 MG PO TABS: ORAL_TABLET | ORAL | 2 refills | Status: DC

## 2016-10-03 MED FILL — LISINOPRIL 20 MG TABLET: 20 | 90 days supply | Qty: 90 | Fill #0

## 2016-10-03 MED FILL — metFORMIN HCL 1000 MG TABS: 1000 | 90 days supply | Qty: 90 | Fill #0

## 2016-10-03 NOTE — Progress Notes (Signed)
   Subjective:    Patient ID: TERRALL HINZMAN, male    DOB: 1966/05/11, 51 y.o.   MRN: [id number]  HPI Pt is a 51 yo male who presents to the clinic to establish care after PCP left clinic. He needs refills today.   .. Active Ambulatory Problems    Diagnosis Date Noted  . Sleep disturbance 10/16/2013  . Snoring 10/16/2013  . Bipolar 1 disorder ([place]) 10/16/2013  . Obstructive sleep apnea 12/03/2013  . Essential hypertension 11/02/2014  . Hyperglycemia 01/05/2015  . Type 2 diabetes mellitus ([place]) 02/02/2015  . Poison ivy 05/05/2015   Resolved Ambulatory Problems    Diagnosis Date Noted  . No Resolved Ambulatory Problems   Past Medical History:  Diagnosis Date  . Bipolar 1 disorder ([place])   . Depression    .[name] Family History  Problem Relation Age of Onset  . Prostate cancer      uncle  . Atrial fibrillation Father   . Congestive Heart Failure     .[name] Social History   Social History  . Marital status: Married    Spouse name: N/A  . Number of children: N/A  . Years of education: N/A   Occupational History  . Not on file.   Social History Main Topics  . Smoking status: Never Smoker  . Smokeless tobacco: Not on file  . Alcohol use No  . Drug use: No  . Sexual activity: Not Currently    Partners: Female   Other Topics Concern  . Not on file   Social History Narrative  . No narrative on file   DM- taking metformin without complications. Not exercising but is going to start indoor tennis. No open sores. No hypoglycemia. Not checking BS. Last eye exam 2016 and normal.    HTN- taking lisinopril daily. No CP, palpitations, headaches, dizziness.    Review of Systems  All other systems reviewed and are negative.      Objective:   Physical Exam  Constitutional: He is oriented to person, place, and time. He appears well-developed and well-nourished.  HENT:  Head: Normocephalic and atraumatic.  Cardiovascular: Normal rate, regular rhythm and normal heart  sounds.   Pulmonary/Chest: Effort normal and breath sounds normal. He has no wheezes.  Neurological: He is alert and oriented to person, place, and time.  Psychiatric: He has a normal mood and affect. His behavior is normal.          Assessment & Plan:  .[name] was seen today for diabetes.  Diagnoses and all orders for this visit:  Type 2 diabetes mellitus without complication, without long-term current use of insulin (HCC) -     POCT HgB A1C -     metFORMIN (GLUCOPHAGE) 1000 MG tablet; One tablet by mouth every evening for blood sugar control.  Essential hypertension -     lisinopril (PRINIVIL,ZESTRIL) 20 MG tablet; One tablet by mouth daily for blood pressure control.      Last A1C is 6.1 unchanged in 6 months.  Continue on metformin.  Pt declined pneuomonia vaccine.  Encouraged eye exam annually.  Follow up in 6 months.  Encouraged diet and exercise.

## 2016-10-09 ENCOUNTER — Ambulatory Visit (INDEPENDENT_AMBULATORY_CARE_PROVIDER_SITE_OTHER): Payer: 59 | Admitting: Physician Assistant

## 2016-10-09 ENCOUNTER — Encounter: Payer: Self-pay | Admitting: Physician Assistant

## 2016-10-09 VITALS — BP 125/79 | HR 91 | Temp 98.5°F | Ht 70.0 in | Wt 231.0 lb

## 2016-10-09 DIAGNOSIS — J069 Acute upper respiratory infection, unspecified: Secondary | ICD-10-CM | POA: Diagnosis not present

## 2016-10-09 DIAGNOSIS — M7702 Medial epicondylitis, left elbow: Secondary | ICD-10-CM | POA: Insufficient documentation

## 2016-10-09 MED ORDER — IPRATROPIUM BROMIDE 0.06 % NA SOLN: 2.0000 | Freq: Four times a day (QID) | NASAL | 1 refills | Status: DC

## 2016-10-09 NOTE — Patient Instructions (Addendum)
Tylenol cold sinus SEVERE Mucinex, sudafed, acetaminophen.      Upper Respiratory Infection, Adult Most upper respiratory infections (URIs) are a viral infection of the air passages leading to the lungs. A URI affects the nose, throat, and upper air passages. The most common type of URI is nasopharyngitis and is typically referred to as "the common cold." URIs run their course and usually go away on their own. Most of the time, a URI does not require medical attention, but sometimes a bacterial infection in the upper airways can follow a viral infection. This is called a secondary infection. Sinus and middle ear infections are common types of secondary upper respiratory infections. Bacterial pneumonia can also complicate a URI. A URI can worsen asthma and chronic obstructive pulmonary disease (COPD). Sometimes, these complications can require emergency medical care and may be life threatening. What are the causes? Almost all URIs are caused by viruses. A virus is a type of germ and can spread from one person to another. What increases the risk? You may be at risk for a URI if:  You smoke.  You have chronic heart or lung disease.  You have a weakened defense (immune) system.  You are very young or very old.  You have nasal allergies or asthma.  You work in crowded or poorly ventilated areas.  You work in health care facilities or schools. What are the signs or symptoms? Symptoms typically develop 2-3 days after you come in contact with a cold virus. Most viral URIs last 7-10 days. However, viral URIs from the influenza virus (flu virus) can last 14-18 days and are typically more severe. Symptoms may include:  Runny or stuffy (congested) nose.  Sneezing.  Cough.  Sore throat.  Headache.  Fatigue.  Fever.  Loss of appetite.  Pain in your forehead, behind your eyes, and over your cheekbones (sinus pain).  Muscle aches. How is this diagnosed? Your health care provider  may diagnose a URI by:  Physical exam.  Tests to check that your symptoms are not due to another condition such as:  Strep throat.  Sinusitis.  Pneumonia.  Asthma. How is this treated? A URI goes away on its own with time. It cannot be cured with medicines, but medicines may be prescribed or recommended to relieve symptoms. Medicines may help:  Reduce your fever.  Reduce your cough.  Relieve nasal congestion. Follow these instructions at home:  Take medicines only as directed by your health care provider.  Gargle warm saltwater or take cough drops to comfort your throat as directed by your health care provider.  Use a warm mist humidifier or inhale steam from a shower to increase air moisture. This may make it easier to breathe.  Drink enough fluid to keep your urine clear or pale yellow.  Eat soups and other clear broths and maintain good nutrition.  Rest as needed.  Return to work when your temperature has returned to normal or as your health care provider advises. You may need to stay home longer to avoid infecting others. You can also use a face mask and careful hand washing to prevent spread of the virus.  Increase the usage of your inhaler if you have asthma.  Do not use any tobacco products, including cigarettes, chewing tobacco, or electronic cigarettes. If you need help quitting, ask your health care provider. How is this prevented? The best way to protect yourself from getting a cold is to practice good hygiene.  Avoid oral or hand contact  with people with cold symptoms.  Wash your hands often if contact occurs. There is no clear evidence that vitamin C, vitamin E, echinacea, or exercise reduces the chance of developing a cold. However, it is always recommended to get plenty of rest, exercise, and practice good nutrition. Contact a health care provider if:  You are getting worse rather than better.  Your symptoms are not controlled by medicine.  You have  chills.  You have worsening shortness of breath.  You have brown or red mucus.  You have yellow or brown nasal discharge.  You have pain in your face, especially when you bend forward.  You have a fever.  You have swollen neck glands.  You have pain while swallowing.  You have white areas in the back of your throat. Get help right away if:  You have severe or persistent:  Headache.  Ear pain.  Sinus pain.  Chest pain.  You have chronic lung disease and any of the following:  Wheezing.  Prolonged cough.  Coughing up blood.  A change in your usual mucus.  You have a stiff neck.  You have changes in your:  Vision.  Hearing.  Thinking.  Mood. This information is not intended to replace advice given to you by your health care provider. Make sure you discuss any questions you have with your health care provider. Document Released: 03/13/2001 Document Revised: 05/20/2016 Document Reviewed: 12/23/2013 Elsevier Interactive Patient Education  2017 [facility] Ask your health care provider which exercises are safe for you. Do exercises exactly as told by your health care provider and adjust them as directed. It is normal to feel mild stretching, pulling, tightness, or discomfort as you do these exercises, but you should stop right away if you feel sudden pain or your pain gets worse. Do not begin these exercises until told by your health care provider. Stretching and range of motion exercises These exercises warm up your muscles and joints and improve the movement and flexibility of your elbow. Exercise A: Wrist flexors 1. Straighten your left / right elbow in front of you with your palm up. If told by your health care provider, do this stretch with your elbow bent rather than straight. 2. With your other hand, gently pull your left / right hand and fingers toward you until you feel a gentle stretch on the top of your forearm. 3. Hold  this position for __________ seconds. Repeat __________ times. Complete this exercise __________ times a day. Strengthening exercises These exercises build strength and endurance in your elbow. Endurance is the ability to use your muscles for a long time, even after several repetitions. Exercise B: Wrist flexion 1. Sit with your left / right forearm palm-up and supported on a table or other surface. 2. Let your left / right wrist extend over the edge of the surface. 3. Hold a __________ weight or a piece of rubber exercise band or tubing. Slowly curl your hand up toward your forearm. Try to only move your hand and keep the rest of your arm still. 4. Hold this position for __________ seconds. 5. Slowly return to the starting position. Repeat __________ times. Complete this exercise __________ times a day. Exercise C: Eccentric wrist flexion 1. Sit with your left / right forearm palm-up and supported on a table or other surface. 2. Let your left / right wrist extend over the edge of the surface. 3. Hold a __________ weight or a piece of  rubber exercise band or tubing. 4. Use your other hand to move your left / right hand up toward your forearm. 5. Slowly return to the starting position using only your left / right hand. Repeat __________ times. Complete this exercise __________ times a day. Exercise D: Hand turns, pronation i 1. Sit with your left / right forearm supported on a table or other surface. 2. Move your wrist so your pinkie travels toward your forearm and your thumb moves away from your forearm. 3. Hold this position for __________ seconds. 4. Slowly return to the starting position. Exercise E: Hand turns, pronation ii 1. Sit with your left / right forearm supported on a table or other surface. 2. Hold a hammer in your left / right hand.  The exercise will be easier if you hold on near the head of the hammer.  If you hold on toward the end of the handle, the exercise will be  harder. 3. Rest your left / right hand over the edge of the table with your palm facing up. 4. Without moving your elbow, slowly turn your palm and your hand down toward the table. 5. Hold this position for __________ seconds. 6. Slowly return to the starting position. Repeat __________ times. Complete this exercise __________ times a day. Exercise F: Shoulder blade squeeze 1. Sit in a stable chair with good posture. Do not let your back touch the back of the chair. 2. Your arms should be at your sides with your elbows bent. You can rest your forearms on a pillow. 3. Squeeze your shoulder blades together. Keep your shoulders level. Do not lift your shoulders up toward your ears. 4. Hold this position for __________ seconds. 5. Slowly release. Repeat __________ times. Complete this exercise __________ times a day. This information is not intended to replace advice given to you by your health care provider. Make sure you discuss any questions you have with your health care provider. Document Released: 09/17/2005 Document Revised: 05/24/2016 Document Reviewed: 05/30/2015 Elsevier Interactive Patient Education  2017 [employer].

## 2016-10-09 NOTE — Progress Notes (Signed)
   Subjective:    Patient ID: Mark Stevens, male    DOB: 1966/08/06, 51 y.o.   MRN: [id number]  HPI  Pt is a 51 yo male who presents to the clinic with 4 days of sinus congestion, ST, sinus drainage. He denies any ear pain, fever, n/v/d. He has a occasional cough. Not tried anything to make better.   For the last 2 weeks off and on having left medial elbow pain. Tender to touch. Not tried anything to make better. 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 No known injuries. At times when he twist his left elbow causes pain.    Review of Systems See HPI.     Objective:   Physical Exam  Constitutional: He is oriented to person, place, and time. He appears well-developed and well-nourished.  HENT:  Head: Normocephalic and atraumatic.  TM"s clear bilaterally.  Oropharynx erythematous with no tonsilar swelling or exudate.  Bilateral nasal turbinates red and swollen.  Negative for any maxillary or frontal tenderness to palpation.   Eyes: Conjunctivae are normal. Right eye exhibits no discharge. Left eye exhibits no discharge.  Neck: Normal range of motion. Neck supple.  Cardiovascular: Normal rate, regular rhythm and normal heart sounds.   Pulmonary/Chest: Effort normal and breath sounds normal. He has no wheezes.  Musculoskeletal: Normal range of motion.  NROM of left elbow.  Tenderness to palpation over medial epicondyle.  Strength 5/5 of bilateral upper extremity.   Lymphadenopathy:    He has cervical adenopathy.  Neurological: He is alert and oriented to person, place, and time.  Psychiatric: He has a normal mood and affect. His behavior is normal.          Assessment & Plan:  .[name] was seen today for cough and nasal congestion.  Diagnoses and all orders for this visit:  Acute upper respiratory infection -     ipratropium (ATROVENT) 0.06 % nasal spray; Place 2 sprays into both nostrils 4 (four) times daily.  Medial epicondylitis of elbow, left   Discussed other  symptomatic care for URI. Encouraged tylenol, sudafed, mucinex for next 2-3 days. atrovent given bid as needed. If not improving at end of week will consider abx therapy.   Discussed ibuprofen 600mg , ice, exercises. Given HO with exercises. Encouraged elbow strap to help elbow rest. Follow up with sports medicine as needed.

## 2016-11-05 MED FILL — ATORVASTATIN 10 MG TABLET: 10 | 90 days supply | Qty: 90 | Fill #3

## 2016-11-09 DIAGNOSIS — F319 Bipolar disorder, unspecified: Secondary | ICD-10-CM | POA: Diagnosis not present

## 2016-11-09 MED FILL — DIVALPROEX SOD ER 500 MG TA: 500 | 90 days supply | Qty: 180 | Fill #0 | Status: TO

## 2016-11-09 MED FILL — BUPROPION HCL XL 300 MG TAB: 300 | 90 days supply | Qty: 90 | Fill #0 | Status: TO

## 2017-01-04 ENCOUNTER — Ambulatory Visit (INDEPENDENT_AMBULATORY_CARE_PROVIDER_SITE_OTHER): Payer: 59 | Admitting: Physician Assistant

## 2017-01-04 VITALS — BP 119/79 | HR 75 | Wt 225.0 lb

## 2017-01-04 DIAGNOSIS — D2239 Melanocytic nevi of other parts of face: Secondary | ICD-10-CM

## 2017-01-04 DIAGNOSIS — D229 Melanocytic nevi, unspecified: Secondary | ICD-10-CM | POA: Insufficient documentation

## 2017-01-04 NOTE — Patient Instructions (Signed)
Mole A mole is a colored (pigmented) growth on the skin. Moles are very common. They are usually harmless, but some moles can become cancerous over time. What are the causes? Moles occur when pigmented skin cells grow together in clusters instead of spreading out in the skin as they normally do. The reason why the skin cells grow together in clusters is not known. What are the signs or symptoms? A mole may be:  [place] or black.  Flat or raised.  Smooth or wrinkled. How is this diagnosed? A mole is diagnosed with a skin exam. If your health care provider thinks a mole may be cancerous, a piece of the mole will be removed for testing. How is this treated? Treatment is not needed unless a mole is cancerous. If a mole is cancerous, it will be removed. If a mole is causing pain or you do not like the way it looks, you may choose to have it removed. Follow these instructions at home:  Every month, look for new moles and check your existing moles for changes. This is important because a change in a mole can mean that the mole has become cancerous. Look for changes in:  Size. Look for moles that are more than  in (0.64 cm) wide (in diameter).  Shape. Look for moles that are not round or oval.  Borders. Look for moles that are not symmetrical.  Color. Note that it is normal for moles to get darker during pregnancy or when you take birth control pills.  When you are outdoors, wear sunscreen with SPF 30 (sun protection factor 30) or higher. Reapply the sunscreen every 2-3 hours.  If you have a large number of moles, see a skin doctor (dermatologist) at least one time every year. Contact a health care provider if:  The size, shape, borders, or color of your mole change.  Your mole, or the skin near the mole, becomes painful, sore, red, or swollen.  Your mole:  Develops more than one color.  Itches or bleeds.  Becomes scaly, sheds skin, or oozes fluid.  Becomes flat or develops raised  areas.  Becomes hard or soft.  You develop a new mole. This information is not intended to replace advice given to you by your health care provider. Make sure you discuss any questions you have with your health care provider. Document Released: 06/12/2001 Document Revised: 02/29/2016 Document Reviewed: 07/08/2015 Elsevier Interactive Patient Education  2017 [employer].

## 2017-01-04 NOTE — Progress Notes (Signed)
HPI:                                                                Mark Stevens is a 51 y.o. male who presents to [facility] today for mole  Patient concerned today about a dark spot on his right temple. He states it has been present for months and he thinks it has increased in size. Denies bleeding, scaling, or tenderness. Denies personal or family history of skin cancer.  Past Medical History:  Diagnosis Date  . Bipolar 1 disorder ([place])   . Depression    No past surgical history on file. Social History  Substance Use Topics  . Smoking status: Never Smoker  . Smokeless tobacco: Not on file  . Alcohol use No   family history includes Atrial fibrillation in his father.  ROS: negative except as noted in the HPI  Medications: Current Outpatient Prescriptions  Medication Sig Dispense Refill  . atorvastatin (LIPITOR) 10 MG tablet Take 1 tablet (10 mg total) by mouth daily. 90 tablet 3  . buPROPion (WELLBUTRIN XL) 300 MG 24 hr tablet Take 300 mg by mouth daily.    . divalproex (DEPAKOTE ER) 500 MG 24 hr tablet Take by mouth daily.    . fluticasone (FLONASE) 50 MCG/ACT nasal spray Place 2 sprays into both nostrils daily. 16 g 1  . ipratropium (ATROVENT) 0.06 % nasal spray Place 2 sprays into both nostrils 4 (four) times daily. 15 mL 1  . lisinopril (PRINIVIL,ZESTRIL) 20 MG tablet One tablet by mouth daily for blood pressure control. 90 tablet 2  . metFORMIN (GLUCOPHAGE) 1000 MG tablet One tablet by mouth every evening for blood sugar control. 90 tablet 2   No current facility-administered medications for this visit.    Allergies  Allergen Reactions  . Epinephrine   . Prednisone     Vision loss       Objective:  BP 119/79   Pulse 75   Wt 225 lb (102.1 kg)   BMI 32.28 kg/m  Gen: well-groomed, cooperative, not ill-appearing, no distress HEENT: normocephalic, atraumatic Skin: warm, dry, intact, approx 40mm  hyperpigmented, round, symmetric nevus with regular borders on right temple, scalp with multipe benign appearing nevi   No results found for this or any previous visit (from the past 72 hour(s)). No results found.    Assessment and Plan: 51 y.o. male with  1. Melanocytic nevus of face, other location - nevus benign appearing. Instructed patient to take a photograph and continue to monitor it for changes. Educated on ABCDE rule - instructed patient to wear broad-spectrum sunscreen when outdoors  Patient education and anticipatory guidance given Patient agrees with treatment plan Follow-up as needed if symptoms worsen or fail to improve  Darlyne Russian PA-C

## 2017-01-22 DIAGNOSIS — F319 Bipolar disorder, unspecified: Secondary | ICD-10-CM | POA: Diagnosis not present

## 2017-01-24 ENCOUNTER — Encounter: Payer: Self-pay | Admitting: *Deleted

## 2017-01-24 ENCOUNTER — Telehealth: Payer: Self-pay | Admitting: Emergency Medicine

## 2017-01-24 ENCOUNTER — Emergency Department
Admission: EM | Admit: 2017-01-24 | Discharge: 2017-01-24 | Disposition: A | Discharge status: Home / Self Care | Payer: 59 | Source: Home / Self Care | Attending: Family Medicine | Admitting: Family Medicine

## 2017-01-24 DIAGNOSIS — J029 Acute pharyngitis, unspecified: Secondary | ICD-10-CM | POA: Diagnosis not present

## 2017-01-24 HISTORY — DX: Essential (primary) hypertension: I10

## 2017-01-24 HISTORY — DX: Hyperlipidemia, unspecified: E78.5

## 2017-01-24 LAB — POCT RAPID STREP A (OFFICE): Rapid Strep A Screen: NEGATIVE

## 2017-01-24 NOTE — Telephone Encounter (Signed)
Encounter for addition of order.

## 2017-01-24 NOTE — ED Provider Notes (Signed)
CSN: [id number]     Arrival date & time 01/24/17  1353 History   First MD Initiated Contact with Patient 01/24/17 1431     Chief Complaint  Patient presents with  . Sore Throat   (Consider location/radiation/quality/duration/timing/severity/associated sxs/prior Treatment) HPI Mark Stevens is a 51 y.o. male presenting to UC with c/o 1 week of sore throat and post-nasal drip.  He also reports feeling very fatigued and occasional dry cough.  Throat pain is most bothersome for pt, 6/10 in severity.  Denies known sick contacts. Denies fever, chills, n/v/d. He has been able to keep down fluids. Denies SOB.    Past Medical History:  Diagnosis Date  . Bipolar 1 disorder ([place])   . Depression   . Hyperlipidemia   . Hypertension    History reviewed. No pertinent surgical history. Family History  Problem Relation Age of Onset  . Prostate cancer      uncle  . Atrial fibrillation Father   . Congestive Heart Failure     Social History  Substance Use Topics  . Smoking status: Never Smoker  . Smokeless tobacco: Never Used  . Alcohol use No    Review of Systems  Constitutional: Positive for fatigue. Negative for chills and fever.  HENT: Positive for postnasal drip and sore throat. Negative for congestion, ear pain, trouble swallowing and voice change.   Respiratory: Positive for cough. Negative for shortness of breath.   Cardiovascular: Negative for chest pain and palpitations.  Gastrointestinal: Negative for abdominal pain, diarrhea, nausea and vomiting.  Musculoskeletal: Negative for arthralgias, back pain and myalgias.  Skin: Negative for rash.    Allergies  Epinephrine and Prednisone  Home Medications   Prior to Admission medications   Medication Sig Start Date End Date Taking? Authorizing Provider  atorvastatin (LIPITOR) 10 MG tablet Take 1 tablet (10 mg total) by mouth daily. 01/24/16   Sean Hommel, DO  buPROPion (WELLBUTRIN XL) 300 MG 24 hr tablet Take 300 mg by mouth  daily.    Historical Provider, MD  divalproex (DEPAKOTE ER) 500 MG 24 hr tablet Take by mouth daily.    Historical Provider, MD  fluticasone (FLONASE) 50 MCG/ACT nasal spray Place 2 sprays into both nostrils daily. 03/21/16   [name] L Breeback, PA-C  ipratropium (ATROVENT) 0.06 % nasal spray Place 2 sprays into both nostrils 4 (four) times daily. 10/09/16   [name] L Breeback, PA-C  lisinopril (PRINIVIL,ZESTRIL) 20 MG tablet One tablet by mouth daily for blood pressure control. 10/03/16 10/03/17  [name] L Breeback, PA-C  metFORMIN (GLUCOPHAGE) 1000 MG tablet One tablet by mouth every evening for blood sugar control. 10/03/16 10/03/17  Donella Stade, PA-C   Meds Ordered and Administered this Visit  Medications - No data to display  BP 128/83 (BP Location: Left Arm)   Pulse 79   Temp 99.1 F (37.3 C)   Wt 229 lb (103.9 kg)   SpO2 97%   BMI 32.86 kg/m  No data found.   Physical Exam  Constitutional: He is oriented to person, place, and time. He appears well-developed and well-nourished. No distress.  HENT:  Head: Normocephalic and atraumatic.  Right Ear: Tympanic membrane normal.  Left Ear: Tympanic membrane normal.  Nose: Nose normal.  Mouth/Throat: Uvula is midline and mucous membranes are normal. Posterior oropharyngeal erythema present. No oropharyngeal exudate, posterior oropharyngeal edema or tonsillar abscesses.  Eyes: EOM are normal.  Neck: Normal range of motion. Neck supple.  Cardiovascular: Normal rate and regular rhythm.  Pulmonary/Chest: Effort normal and breath sounds normal. No stridor. No respiratory distress. He has no wheezes. He has no rales.  Musculoskeletal: Normal range of motion.  Lymphadenopathy:    He has no cervical adenopathy.  Neurological: He is alert and oriented to person, place, and time.  Skin: Skin is warm and dry. He is not diaphoretic.  Psychiatric: He has a normal mood and affect. His behavior is normal.  Nursing note and vitals reviewed.   Urgent Care  Course     Procedures (including critical care time)  Labs Review Labs Reviewed  POCT RAPID STREP A (OFFICE)    Imaging Review No results found.   MDM   1. Pharyngitis, unspecified etiology    Pt c/o sore throat for about 1 week.  Rapid strep: Negative Culture sent  Symptoms likely viral. Encouraged symptomatic treatment. Sample of Cepacol throat lozenges provided to pt.     Noland Fordyce, PA-C 01/24/17 [phone]

## 2017-01-24 NOTE — ED Triage Notes (Signed)
Patient c/o 1 week of sore throat and post nasal drainage. More recently very fatigued, dry cough at times.

## 2017-02-07 ENCOUNTER — Telehealth: Payer: Self-pay | Admitting: Physician Assistant

## 2017-02-07 ENCOUNTER — Telehealth: Payer: Self-pay | Admitting: *Deleted

## 2017-02-07 DIAGNOSIS — E1169 Type 2 diabetes mellitus with other specified complication: Secondary | ICD-10-CM

## 2017-02-07 DIAGNOSIS — I10 Essential (primary) hypertension: Secondary | ICD-10-CM

## 2017-02-07 DIAGNOSIS — E119 Type 2 diabetes mellitus without complications: Secondary | ICD-10-CM

## 2017-02-07 DIAGNOSIS — E785 Hyperlipidemia, unspecified: Secondary | ICD-10-CM

## 2017-02-07 MED ORDER — ATORVASTATIN CALCIUM 10 MG PO TABS: 10.0000 mg | ORAL_TABLET | Freq: Every day | ORAL | 0 refills | Status: DC

## 2017-02-07 MED FILL — DIVALPROEX SOD ER 500 MG TA: 500 | 30 days supply | Qty: 60 | Fill #0

## 2017-02-07 MED FILL — BUPROPION HCL XL 300 MG TAB: 300 | 30 days supply | Qty: 30 | Fill #0

## 2017-02-07 MED FILL — metFORMIN HCL 1000 MG TABS: 1000 | 30 days supply | Qty: 30 | Fill #1

## 2017-02-07 MED FILL — ATORVASTATIN 10 MG TABLET: 10 | 30 days supply | Qty: 30 | Fill #0 | Status: TO

## 2017-02-07 MED FILL — LISINOPRIL 20 MG TABLET: 20 | 30 days supply | Qty: 30 | Fill #1

## 2017-02-07 NOTE — Telephone Encounter (Signed)
Patient came in stating that he is out of Lipitor and the pharmacy will not refill it without authorization. Can we send him something in or does he need to make an appointment to follow up? Please advise. Thanks!

## 2017-02-07 NOTE — Telephone Encounter (Signed)
#  30 sent to pharmacy.  Pt given lab slip to get fasting labs drawn.

## 2017-02-08 NOTE — Telephone Encounter (Signed)
Fasting labs ordered

## 2017-03-05 LAB — COMPLETE METABOLIC PANEL WITH GFR
ALT: 12 U/L (ref 9–46)
AST: 11 U/L (ref 10–35)
Albumin: 4.2 g/dL (ref 3.6–5.1)
Alkaline Phosphatase: 59 U/L (ref 40–115)
BUN: 13 mg/dL (ref 7–25)
CO2: 24 mmol/L (ref 20–31)
Calcium: 9.3 mg/dL (ref 8.6–10.3)
Chloride: 105 mmol/L (ref 98–110)
Creat: 1.08 mg/dL (ref 0.70–1.33)
GFR, Est African American: >89 mL/min (ref >=60)
GFR, Est Non African American: 79 mL/min (ref >=60)
Glucose, Bld: 117 mg/dL — ABNORMAL HIGH (ref 65–99)
Potassium: 4.6 mmol/L (ref 3.5–5.3)
Sodium: 140 mmol/L (ref 135–146)
Total Bilirubin: 0.6 mg/dL (ref 0.2–1.2)
Total Protein: 6.5 g/dL (ref 6.1–8.1)

## 2017-03-05 LAB — LIPID PANEL W/REFLEX DIRECT LDL
Cholesterol: 127 mg/dL (ref <200)
HDL: 52 mg/dL (ref >40)
LDL-Cholesterol: 54 mg/dL
Non-HDL Cholesterol (Calc): 75 mg/dL (ref <130)
Total CHOL/HDL Ratio: 2.4 Ratio (ref <5.0)
Triglycerides: 133 mg/dL (ref <150)

## 2017-03-05 NOTE — Telephone Encounter (Signed)
Call pt: cholesterol looks great! TG normal. Refill lipitor for one year.  Kidney liver look great.  Need a1c when is his next follow up. Should have been ordered with labs.

## 2017-03-06 ENCOUNTER — Other Ambulatory Visit: Payer: Self-pay

## 2017-03-06 MED ORDER — ATORVASTATIN CALCIUM 10 MG PO TABS: 10.0000 mg | ORAL_TABLET | Freq: Every day | ORAL | 3 refills | Status: DC

## 2017-03-13 MED FILL — LISINOPRIL 20 MG TABLET: 20 | 30 days supply | Qty: 30 | Fill #2 | Status: TO

## 2017-03-13 MED FILL — ATORVASTATIN 10 MG TABLET: 10 | 30 days supply | Qty: 30 | Fill #0 | Status: TO

## 2017-03-13 MED FILL — DIVALPROEX SOD ER 500 MG TA: 500 | 30 days supply | Qty: 60 | Fill #1 | Status: TO

## 2017-03-13 MED FILL — metFORMIN HCL 1000 MG TABS: 1000 | 30 days supply | Qty: 30 | Fill #2 | Status: TO

## 2017-03-13 MED FILL — BUPROPION HCL XL 300 MG TAB: 300 | 30 days supply | Qty: 30 | Fill #1 | Status: TO

## 2017-04-12 ENCOUNTER — Ambulatory Visit: Payer: 59 | Admitting: Physician Assistant

## 2017-04-19 ENCOUNTER — Ambulatory Visit: Payer: 59 | Admitting: Physician Assistant

## 2017-04-30 ENCOUNTER — Emergency Department
Admission: EM | Admit: 2017-04-30 | Discharge: 2017-04-30 | Disposition: A | Discharge status: Home / Self Care | Payer: 59 | Source: Home / Self Care | Attending: Family Medicine | Admitting: Family Medicine

## 2017-04-30 ENCOUNTER — Emergency Department (INDEPENDENT_AMBULATORY_CARE_PROVIDER_SITE_OTHER): Payer: 59

## 2017-04-30 ENCOUNTER — Encounter: Payer: Self-pay | Admitting: *Deleted

## 2017-04-30 DIAGNOSIS — N200 Calculus of kidney: Secondary | ICD-10-CM

## 2017-04-30 DIAGNOSIS — N132 Hydronephrosis with renal and ureteral calculous obstruction: Secondary | ICD-10-CM

## 2017-04-30 DIAGNOSIS — N201 Calculus of ureter: Secondary | ICD-10-CM

## 2017-04-30 LAB — POCT URINALYSIS DIP (MANUAL ENTRY)
Bilirubin, UA: NEGATIVE
Glucose, UA: NEGATIVE mg/dL
Leukocytes, UA: NEGATIVE
Nitrite, UA: NEGATIVE
Protein Ur, POC: 30 mg/dL — AB
Spec Grav, UA: 1.015 (ref 1.010–1.025)
Urobilinogen, UA: 0.2 E.U./dL
pH, UA: 8.5 — AB (ref 5.0–8.0)

## 2017-04-30 MED ORDER — ONDANSETRON 4 MG PO TBDP
4.0000 mg | ORAL_TABLET | Freq: Once | ORAL | Status: AC
  Administered 2017-04-30: 4 mg via ORAL

## 2017-04-30 MED ORDER — HYDROCODONE-ACETAMINOPHEN 5-325 MG PO TABS: 1.0000 | ORAL_TABLET | Freq: Four times a day (QID) | ORAL | 0 refills | Status: DC | PRN

## 2017-04-30 MED ORDER — KETOROLAC TROMETHAMINE 60 MG/2ML IM SOLN
60.0000 mg | Freq: Once | INTRAMUSCULAR | Status: AC
Start: 2017-04-30 — End: 2017-04-30
  Administered 2017-04-30: 60 mg via INTRAMUSCULAR

## 2017-04-30 MED ORDER — TAMSULOSIN HCL 0.4 MG PO CAPS: 0.4000 mg | ORAL_CAPSULE | Freq: Every day | ORAL | 0 refills | Status: DC

## 2017-04-30 MED ORDER — ONDANSETRON 4 MG PO TBDP: ORAL_TABLET | ORAL | 0 refills | Status: DC

## 2017-04-30 NOTE — ED Triage Notes (Signed)
Pt c/o LT flank pain x 2 hours. Denies hx of kidney stone. Reports family hx of father with kidney stones.

## 2017-04-30 NOTE — Discharge Instructions (Signed)
Increase fluid intake.  Strain urine to collect kidney stone when passed.  May take Ibuprofen 200mg , 4 tabs every 8 hours with food. If symptoms become significantly worse during the night or over the weekend, proceed to the local emergency room.

## 2017-04-30 NOTE — ED Notes (Signed)
To CT

## 2017-04-30 NOTE — ED Provider Notes (Signed)
[facility]    CSN: [id number] Arrival date & time: 04/30/17  1542     History   Chief Complaint Chief Complaint  Patient presents with  . Flank Pain    HPI Mark Stevens is a 51 y.o. male.   Patient complains of onset of left flank pain two hours ago, with nausea and cold sweats.  No urinary symptoms.  No changes in bowel movements. Family history of kidney stones (father).   The history is provided by the patient and a parent.  Flank Pain  This is a new problem. Episode onset: 2 hours ago. The problem occurs constantly. The problem has not changed since onset.Pertinent negatives include no chest pain, no abdominal pain and no headaches. Nothing aggravates the symptoms. Nothing relieves the symptoms. He has tried nothing for the symptoms.    Past Medical History:  Diagnosis Date  . Bipolar 1 disorder ([place])   . Depression   . Hyperlipidemia   . Hypertension     Patient Active Problem List   Diagnosis Date Noted  . 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 Medial epicondylitis of elbow, left 10/09/2016  . Poison ivy 05/05/2015  . Type 2 diabetes mellitus ([place]) 02/02/2015  . Hyperglycemia 01/05/2015  . Essential hypertension 11/02/2014  . Obstructive sleep apnea 12/03/2013  . Sleep disturbance 10/16/2013  . Snoring 10/16/2013  . Bipolar 1 disorder ([place]) 10/16/2013    History reviewed. No pertinent surgical history.     Home Medications    Prior to Admission medications   Medication Sig Start Date End Date Taking? Authorizing Provider  atorvastatin (LIPITOR) 10 MG tablet Take 1 tablet (10 mg total) by mouth daily. 03/06/17   Breeback, [name] L, PA-C  buPROPion (WELLBUTRIN XL) 300 MG 24 hr tablet Take 300 mg by mouth daily.    [provider]  divalproex (DEPAKOTE ER) 500 MG 24 hr tablet Take by mouth daily.    [provider]  fluticasone (FLONASE) 50 MCG/ACT nasal spray Place 2 sprays into both nostrils daily. 03/21/16   Breeback, Royetta Car,  PA-C  HYDROcodone-acetaminophen (NORCO/VICODIN) 5-325 MG tablet Take 1 tablet by mouth every 6 (six) hours as needed for moderate pain. 04/30/17   Kandra Nicolas, MD  ipratropium (ATROVENT) 0.06 % nasal spray Place 2 sprays into both nostrils 4 (four) times daily. 10/09/16   Breeback, [name] L, PA-C  lisinopril (PRINIVIL,ZESTRIL) 20 MG tablet One tablet by mouth daily for blood pressure control. 10/03/16 10/03/17  Breeback, Royetta Car, PA-C  metFORMIN (GLUCOPHAGE) 1000 MG tablet One tablet by mouth every evening for blood sugar control. 10/03/16 10/03/17  Breeback, [name] L, PA-C  ondansetron (ZOFRAN ODT) 4 MG disintegrating tablet Take one tab by mouth Q6hr prn nausea.  Dissolve under tongue. 04/30/17   Kandra Nicolas, MD  tamsulosin (FLOMAX) 0.4 MG CAPS capsule Take 1 capsule (0.4 mg total) by mouth daily after supper. 04/30/17   Kandra Nicolas, MD    Family History Family History  Problem Relation Age of Onset  . Prostate cancer Unknown        uncle  . Atrial fibrillation Father   . Congestive Heart Failure Unknown     Social History Social History  Substance Use Topics  . Smoking status: Never Smoker  . Smokeless tobacco: Never Used  . Alcohol use No     Allergies   Epinephrine and Prednisone   Review of Systems Review of Systems  Constitutional: Positive for activity change, appetite change, diaphoresis and  fatigue. Negative for chills and fever.  HENT: Negative.   Eyes: Negative.   Respiratory: Negative.   Cardiovascular: Negative for chest pain.  Gastrointestinal: Positive for nausea. Negative for abdominal distention, abdominal pain, blood in stool, constipation, diarrhea and vomiting.  Genitourinary: Positive for flank pain. Negative for difficulty urinating, dysuria, frequency, hematuria, penile pain and testicular pain.  Skin: Negative.   Neurological: Negative for headaches.     Physical Exam Triage Vital Signs ED Triage Vitals  Enc Vitals Group     BP 04/30/17 1556 (!)  130/91     Pulse Rate 04/30/17 1556 70     Resp 04/30/17 1556 18     Temp 04/30/17 1556 97.9 F (36.6 C)     Temp Source 04/30/17 1556 Oral     SpO2 04/30/17 1556 98 %     Weight 04/30/17 1556 230 lb (104.3 kg)     Height --      Head Circumference --      Peak Flow --      Pain Score 04/30/17 1557 7     Pain Loc --      Pain Edu? --      Excl. in [place]? --    No data found.   Updated Vital Signs BP (!) 130/91 (BP Location: Left Arm)   Pulse 70   Temp 97.9 F (36.6 C) (Oral)   Resp 18   Wt 230 lb (104.3 kg)   SpO2 98%   BMI 33.00 kg/m   Visual Acuity Right Eye Distance:   Left Eye Distance:   Bilateral Distance:    Right Eye Near:   Left Eye Near:    Bilateral Near:     Physical Exam Nursing notes and Vital Signs reviewed. Appearance:  Patient appears stated age and uncomfortable, but in no acute distress.    Eyes:  Pupils are equal, round, and reactive to light and accomodation.  Extraocular movement is intact.  Conjunctivae are not inflamed   Pharynx:  Normal; moist mucous membranes  Neck:  Supple.  No adenopathy Lungs:  Clear to auscultation.  Breath sounds are equal.  Moving air well. Heart:  Regular rate and rhythm without murmurs, rubs, or gallops.  Abdomen:  Vague right flank tenderness without masses or hepatosplenomegaly.  Bowel sounds are present. Extremities:  No edema.  Skin:  No rash present.     UC Treatments / Results  Labs (all labs ordered are listed, but only abnormal results are displayed) Labs Reviewed  POCT URINALYSIS DIP (MANUAL ENTRY) - Abnormal; Notable for the following:       Result Value   Ketones, POC UA small (15) (*)    Blood, UA large (*)    pH, UA 8.5 (*)    Protein Ur, POC =30 (*)    All other components within normal limits    EKG  EKG Interpretation None       Radiology Ct Renal Stone Study  Result Date: 04/30/2017 CLINICAL DATA:  Left flank pain since this morning. EXAM: CT ABDOMEN AND PELVIS WITHOUT CONTRAST  TECHNIQUE: Multidetector CT imaging of the abdomen and pelvis was performed following the standard protocol without IV contrast. COMPARISON:  None. FINDINGS: Lower chest:  Unremarkable. Hepatobiliary: Small liver cyst.  Normal appearing gallbladder. Pancreas: Unremarkable. No pancreatic ductal dilatation or surrounding inflammatory changes. Spleen: Normal in size without focal abnormality. Adrenals/Urinary Tract: Normal appearing adrenal glands. Small bilateral renal calculi. The largest is in the lower pole of the left kidney,  measuring 4 mm in maximum diameter. There is also a 2 mm distal left ureteral calculus near the ureterovesical junction with mild dilatation of the ureter proximal to that level as well as mild dilatation of the left renal collecting system. There is also mild left periureteric soft tissue stranding. The calculus is not visible on the scout image. No bladder or right ureteral calculi. Stomach/Bowel: Multiple descending colon diverticula without evidence of diverticulitis. Normal appearing appendix, small bowel and stomach. Vascular/Lymphatic: No significant vascular findings are present. No enlarged abdominal or pelvic lymph nodes. Reproductive: Prostate is unremarkable. Other: No abdominal wall hernia or abnormality. No abdominopelvic ascites. Musculoskeletal: Lumbar and lower thoracic spine degenerative changes. L3 vertebral hemangioma. Mild scoliosis. IMPRESSION: 1. 2 mm distal left ureteral calculus causing mild left hydronephrosis and hydroureter. 2. Multiple small, nonobstructing bilateral renal calculi. 3. Descending colon diverticulosis. Electronically Signed   By: Claudie Revering M.D.   On: 04/30/2017 17:50    Procedures Procedures (including critical care time)  Medications Ordered in UC Medications  ketorolac (TORADOL) injection 60 mg (60 mg Intramuscular Given 04/30/17 1553)  ondansetron (ZOFRAN-ODT) disintegrating tablet 4 mg (4 mg Oral Given 04/30/17 1554)     Initial  Impression / Assessment and Plan / UC Course  I have reviewed the triage vital signs and the nursing notes.  Pertinent labs & imaging results that were available during my care of the patient were reviewed by me and considered in my medical decision making (see chart for details).    Administered Zofran ODT 4mg  PO. Administered Toradol 60mg  IM  Begin Flomax.  Rx for Zofran ODT 4mg . Rx for Lortab (#15, no refill) Controlled Substance Prescriptions I have consulted the [place] Controlled Substances Registry for this patient, and feel the risk/benefit ratio today is favorable for proceeding with this prescription for a controlled substance.  Increase fluid intake.  Strain urine to collect kidney stone when passed (for later analysis).  May take Ibuprofen 200mg , 4 tabs every 8 hours with food. If symptoms become significantly worse during the night or over the weekend, proceed to the local emergency room. Followup with urologist if not improved 6 days.    Final Clinical Impressions(s) / UC Diagnoses   Final diagnoses:  Left nephrolithiasis  Ureterolithiasis    New Prescriptions New Prescriptions   HYDROCODONE-ACETAMINOPHEN (NORCO/VICODIN) 5-325 MG TABLET    Take 1 tablet by mouth every 6 (six) hours as needed for moderate pain.   ONDANSETRON (ZOFRAN ODT) 4 MG DISINTEGRATING TABLET    Take one tab by mouth Q6hr prn nausea.  Dissolve under tongue.   TAMSULOSIN (FLOMAX) 0.4 MG CAPS CAPSULE    Take 1 capsule (0.4 mg total) by mouth daily after supper.     Kandra Nicolas, MD 05/08/17 [phone]

## 2017-05-14 DIAGNOSIS — G4733 Obstructive sleep apnea (adult) (pediatric): Secondary | ICD-10-CM | POA: Diagnosis not present

## 2017-06-05 ENCOUNTER — Ambulatory Visit (INDEPENDENT_AMBULATORY_CARE_PROVIDER_SITE_OTHER): Payer: 59 | Admitting: Physician Assistant

## 2017-06-05 ENCOUNTER — Encounter: Payer: Self-pay | Admitting: Physician Assistant

## 2017-06-05 VITALS — BP 121/67 | HR 78 | Wt 224.0 lb

## 2017-06-05 DIAGNOSIS — N200 Calculus of kidney: Secondary | ICD-10-CM | POA: Insufficient documentation

## 2017-06-05 DIAGNOSIS — F319 Bipolar disorder, unspecified: Secondary | ICD-10-CM | POA: Diagnosis not present

## 2017-06-05 DIAGNOSIS — E119 Type 2 diabetes mellitus without complications: Secondary | ICD-10-CM

## 2017-06-05 DIAGNOSIS — I10 Essential (primary) hypertension: Secondary | ICD-10-CM | POA: Diagnosis not present

## 2017-06-05 LAB — POCT GLYCOSYLATED HEMOGLOBIN (HGB A1C): Hemoglobin A1C: 6.3

## 2017-06-05 MED ORDER — BUPROPION HCL ER (XL) 300 MG PO TB24: 300.0000 mg | ORAL_TABLET | Freq: Every day | ORAL | 2 refills | Status: DC

## 2017-06-05 MED ORDER — LISINOPRIL 20 MG PO TABS: ORAL_TABLET | ORAL | 2 refills | Status: DC

## 2017-06-05 MED ORDER — METFORMIN HCL 1000 MG PO TABS: ORAL_TABLET | ORAL | 1 refills | Status: DC

## 2017-06-05 NOTE — Progress Notes (Signed)
   Subjective:    Patient ID: COLBEY WIRTANEN, male    DOB: [birth year], 51 y.o.   MRN: [id number]  HPI  Pt is a 51 yo male who presents to the clinic for 6 month DM follow up.   DM- doing well on metformin. Not checking sugars. No hypoglycemia. He attempts to make better low carb and low sugar diet options. No problems or concerns. No open wounds or sores.   HtN- denies any CP, palpitations, headaches, vision changes.   Pt had a few kidney stones recently. He is currently pain free. He does see urology currently.   Active Ambulatory Problems    Diagnosis Date Noted  . Sleep disturbance 10/16/2013  . Snoring 10/16/2013  . Bipolar 1 disorder ([place]) 10/16/2013  . Obstructive sleep apnea 12/03/2013  . Essential hypertension 11/02/2014  . Hyperglycemia 01/05/2015  . Type 2 diabetes mellitus ([place]) 02/02/2015  . Poison ivy 05/05/2015  . Medial epicondylitis of elbow, left 10/09/2016  . 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 Nephrolithiasis 06/05/2017   Resolved Ambulatory Problems    Diagnosis Date Noted  . No Resolved Ambulatory Problems   Past Medical History:  Diagnosis Date  . Bipolar 1 disorder ([place])   . Depression   . Hyperlipidemia   . Hypertension       Review of Systems  All other systems reviewed and are negative.      Objective:   Physical Exam  Constitutional: He is oriented to person, place, and time. He appears well-developed and well-nourished.  HENT:  Head: Normocephalic and atraumatic.  Cardiovascular: Normal rate, regular rhythm and normal heart sounds.   Pulmonary/Chest: Effort normal and breath sounds normal.  Neurological: He is alert and oriented to person, place, and time.  Psychiatric: He has a normal mood and affect. His behavior is normal.          Assessment & Plan:   .[name] was seen today for f/u dm.  Diagnoses and all orders for this visit:  Type 2 diabetes mellitus without complication, without long-term current use of insulin (HCC) -      POCT HgB A1C -     metFORMIN (GLUCOPHAGE) 1000 MG tablet; One tablet by mouth every evening for blood sugar control.  Essential hypertension -     lisinopril (PRINIVIL,ZESTRIL) 20 MG tablet; One tablet by mouth daily for blood pressure control.  Nephrolithiasis  Bipolar 1 disorder ([facility]) -     buPROPion (WELLBUTRIN XL) 300 MG 24 hr tablet; Take 1 tablet (300 mg total) by mouth daily.  Other orders -     Discontinue: buPROPion (WELLBUTRIN XL) 300 MG 24 hr tablet; Take 1 tablet (300 mg total) by mouth daily.    .. Lab Results  Component Value Date   HGBA1C 6.3 06/05/2017   A!C controlled.  Continue on metformin.  On STATIN.  On ACE.  On ASA.  Pt declined flu and pneumonia vaccine.  Encouraged eye exam.  Follow up in 6 months.   BP looks great.   Continue with urology follow up. Stay hydrated.

## 2017-06-06 MED ORDER — BUPROPION HCL ER (XL) 300 MG PO TB24: 300.0000 mg | ORAL_TABLET | Freq: Every day | ORAL | 2 refills | Status: DC

## 2017-07-20 ENCOUNTER — Other Ambulatory Visit: Payer: Self-pay | Admitting: Physician Assistant

## 2017-07-23 ENCOUNTER — Other Ambulatory Visit: Payer: Self-pay | Admitting: Physician Assistant

## 2017-07-25 NOTE — Telephone Encounter (Signed)
Is pt still taking this?

## 2017-07-26 NOTE — Telephone Encounter (Signed)
Can we call pt: doesn't look like I have prescribed it.

## 2017-08-05 NOTE — Telephone Encounter (Signed)
Did we find out if patient was still taking this ?

## 2017-08-07 NOTE — Telephone Encounter (Signed)
Do we know anything about this?

## 2017-08-07 NOTE — Telephone Encounter (Signed)
I put on the refill request for pt to contact our office regarding that medication.

## 2017-09-19 ENCOUNTER — Encounter: Payer: Self-pay | Admitting: Family Medicine

## 2017-09-19 ENCOUNTER — Ambulatory Visit (INDEPENDENT_AMBULATORY_CARE_PROVIDER_SITE_OTHER): Payer: 59 | Admitting: Family Medicine

## 2017-09-19 VITALS — BP 122/79 | HR 66 | Temp 98.4°F | Ht 70.0 in | Wt 225.0 lb

## 2017-09-19 DIAGNOSIS — J0101 Acute recurrent maxillary sinusitis: Secondary | ICD-10-CM

## 2017-09-19 MED ORDER — AZITHROMYCIN 250 MG PO TABS: 250.0000 mg | ORAL_TABLET | Freq: Every day | ORAL | 0 refills | Status: DC

## 2017-09-19 MED ORDER — IPRATROPIUM BROMIDE 0.06 % NA SOLN: 2.0000 | NASAL | 6 refills | Status: DC | PRN

## 2017-09-19 NOTE — Patient Instructions (Signed)
Thank you for coming in today. Continue the over the counter cold medicines.  USe atrovent nasal spray.  If not better take azithromycin . Recheck as needed.  Call or go to the emergency room if you get worse, have trouble breathing, have chest pains, or palpitations.    Sinusitis, Adult Sinusitis is soreness and inflammation of your sinuses. Sinuses are hollow spaces in the bones around your face. They are located:  Around your eyes.  In the middle of your forehead.  Behind your nose.  In your cheekbones.  Your sinuses and nasal passages are lined with a stringy fluid (mucus). Mucus normally drains out of your sinuses. When your nasal tissues get inflamed or swollen, the mucus can get trapped or blocked so air cannot flow through your sinuses. This lets bacteria, viruses, and funguses grow, and that leads to infection. Follow these instructions at home: Medicines  Take, use, or apply over-the-counter and prescription medicines only as told by your doctor. These may include nasal sprays.  If you were prescribed an antibiotic medicine, take it as told by your doctor. Do not stop taking the antibiotic even if you start to feel better. Hydrate and Humidify  Drink enough water to keep your pee (urine) clear or pale yellow.  Use a cool mist humidifier to keep the humidity level in your home above 50%.  Breathe in steam for 10-15 minutes, 3-4 times a day or as told by your doctor. You can do this in the bathroom while a hot shower is running.  Try not to spend time in cool or dry air. Rest  Rest as much as possible.  Sleep with your head raised (elevated).  Make sure to get enough sleep each night. General instructions  Put a warm, moist washcloth on your face 3-4 times a day or as told by your doctor. This will help with discomfort.  Wash your hands often with soap and water. If there is no soap and water, use hand sanitizer.  Do not smoke. Avoid being around people who are  smoking (secondhand smoke).  Keep all follow-up visits as told by your doctor. This is important. Contact a doctor if:  You have a fever.  Your symptoms get worse.  Your symptoms do not get better within 10 days. Get help right away if:  You have a very bad headache.  You cannot stop throwing up (vomiting).  You have pain or swelling around your face or eyes.  You have trouble seeing.  You feel confused.  Your neck is stiff.  You have trouble breathing. This information is not intended to replace advice given to you by your health care provider. Make sure you discuss any questions you have with your health care provider. Document Released: 03/05/2008 Document Revised: 05/13/2016 Document Reviewed: 07/13/2015 Elsevier Interactive Patient Education  [employer].

## 2017-09-20 NOTE — Progress Notes (Signed)
Mark Stevens is a 51 y.o. male who presents to [facility] today for sinus pain and pressure congestion and cough.  Symptoms present for 3 or 4 days.  He is tried some over-the-counter medications which have helped.  He notes that in the past this is progressed to a severe bacterial sinus infection and is worried it will get worse over the weekend.   Past Medical History:  Diagnosis Date  . Bipolar 1 disorder ([place])   . Depression   . Hyperlipidemia   . Hypertension    No past surgical history on file. Social History   Tobacco Use  . Smoking status: Never Smoker  . Smokeless tobacco: Never Used  Substance Use Topics  . Alcohol use: No   family history includes Atrial fibrillation in his father; Congestive Heart Failure in his unknown relative; Prostate cancer in his unknown relative.  ROS as above:  Medications: Current Outpatient Medications  Medication Sig Dispense Refill  . atorvastatin (LIPITOR) 10 MG tablet TAKE 1 TABLET EVERY DAY 90 tablet 3  . buPROPion (WELLBUTRIN XL) 300 MG 24 hr tablet Take 1 tablet (300 mg total) by mouth daily. 90 tablet 2  . divalproex (DEPAKOTE ER) 500 MG 24 hr tablet Take by mouth daily.    [name] lisinopril (PRINIVIL,ZESTRIL) 20 MG tablet One tablet by mouth daily for blood pressure control. 90 tablet 2  . metFORMIN (GLUCOPHAGE) 1000 MG tablet One tablet by mouth every evening for blood sugar control. 90 tablet 1  . azithromycin (ZITHROMAX) 250 MG tablet Take 1 tablet (250 mg total) by mouth daily. Take first 2 tablets together, then 1 every day until finished. 6 tablet 0  . ipratropium (ATROVENT) 0.06 % nasal spray Place 2 sprays into both nostrils every 4 (four) hours as needed for rhinitis. 10 mL 6   No current facility-administered medications for this visit.    Allergies  Allergen Reactions  . Epinephrine   . Prednisone      Vision loss    Health Maintenance Health Maintenance  Topic Date Due  . FOOT EXAM  01/04/1976  . OPHTHALMOLOGY EXAM  01/04/1976  . HIV Screening  10/03/2017 (Originally 01/03/1981)  . PNEUMOCOCCAL POLYSACCHARIDE VACCINE (1) 06/06/2018 (Originally 01/04/1968)  . INFLUENZA VACCINE  06/25/2018 (Originally 05/01/2017)  . HEMOGLOBIN A1C  12/03/2017  . Fecal DNA (Cologuard)  03/02/2019  . TETANUS/TDAP  01/22/2026     Exam:  BP 122/79   Pulse 66   Temp 98.4 F (36.9 C) (Oral)   Ht 5\' 10"  (1.778 m)   Wt 225 lb (102.1 kg)   BMI 32.28 kg/m  Gen: Well NAD HEENT: EOMI,  MMM clear nasal discharge.  Tender to palpation bilaterally maxillary sinuses.  Posterior pharynx with cobblestoning.  Normal tympanic membranes bilaterally. Lungs: Normal work of breathing. CTABL Heart: RRR no MRG Abd: NABS, Soft. Nondistended, Nontender Exts: Brisk capillary refill, warm and well perfused.    No results found for this or any previous visit (from the past 72 hour(s)). No results found.    Assessment and Plan: 51 y.o. male with sinusitis likely viral at this point.  Plan for continued over-the-counter medications as well as Atrovent nasal spray.  Patient does not tolerate prednisone.  Will use azithromycin as a backup if patient worsens over the weekend as he has had success with this in the past.   No orders of the defined types were placed in this encounter.  Meds ordered this encounter  Medications  . ipratropium (ATROVENT) 0.06 % nasal spray    Sig: Place 2 sprays into both nostrils every 4 (four) hours as needed for rhinitis.    Dispense:  10 mL    Refill:  6  . azithromycin (ZITHROMAX) 250 MG tablet    Sig: Take 1 tablet (250 mg total) by mouth daily. Take first 2 tablets together, then 1 every day until finished.    Dispense:  6 tablet    Refill:  0     Discussed warning signs or symptoms. Please see discharge instructions. Patient expresses understanding.

## 2017-10-16 ENCOUNTER — Ambulatory Visit: Payer: 59 | Admitting: Family Medicine

## 2017-10-16 ENCOUNTER — Encounter: Payer: Self-pay | Admitting: Family Medicine

## 2017-10-16 VITALS — BP 118/80 | HR 86 | Ht 70.0 in | Wt 230.0 lb

## 2017-10-16 DIAGNOSIS — B360 Pityriasis versicolor: Secondary | ICD-10-CM | POA: Diagnosis not present

## 2017-10-16 DIAGNOSIS — L821 Other seborrheic keratosis: Secondary | ICD-10-CM | POA: Diagnosis not present

## 2017-10-16 MED ORDER — FLUCONAZOLE 150 MG PO TABS: 300.0000 mg | ORAL_TABLET | ORAL | 1 refills | Status: DC

## 2017-10-16 NOTE — Patient Instructions (Signed)
Thank you for coming in today. I think the rash on the back is Tinea Versicolor.  Take fluconazole 300mg  weekly for 2 weeks.  You can also apply Selsin Blue Dandruff shampoo to the rah in the shower and this will help too.  The rash should resolve over a few weeks or months.   The bump on the temple looks like Seborrheic Kerosis which is a non-cancer causing skin change that happens as you get older.  We will keep an eye on it and treat as needed.  Recheck if changing.   Tinea Versicolor Tinea versicolor is a common fungal infection of the skin. It causes a rash that appears as light or dark patches on the skin. The rash most often occurs on the chest, back, neck, or upper arms. This condition is more common during warm weather. Other than affecting how your skin looks, tinea versicolor usually does not cause other problems. In most cases, the infection goes away in a few weeks with treatment. It may take a few months for the patches on your skin to clear up. What are the causes? Tinea versicolor occurs when a type of fungus that is normally present on the skin starts to overgrow. This fungus is a kind of yeast. The exact cause of the overgrowth is not known. This condition cannot be passed from one person to another (noncontagious). What increases the risk? This condition is more likely to develop when certain factors are present, such as:  Heat and humidity.  Sweating too much.  Hormone changes.  Oily skin.  A weak defense (immune) system.  What are the signs or symptoms? Symptoms of this condition may include:  A rash on your skin that is made up of light or dark patches. The rash may have: ? Patches of tan or pink spots on light skin. ? Patches of white or brown spots on dark skin. ? Patches of skin that do not tan. ? Well-marked edges. ? Scales on the discolored areas.  Mild itching.  How is this diagnosed? A health care provider can usually diagnose this condition by  looking at your skin. During the exam, he or she may use ultraviolet light to help determine the extent of the infection. In some cases, a skin sample may be taken by scraping the rash. This sample will be viewed under a microscope to check for yeast overgrowth. How is this treated? Treatment for this condition may include:  Dandruff shampoo that is applied to the affected skin during showers or bathing.  Over-the-counter medicated skin cream, lotion, or soaps.  Prescription antifungal medicine in the form of skin cream or pills.  Medicine to help reduce itching.  Follow these instructions at home:  Take medicines only as directed by your health care provider.  Apply dandruff shampoo to the affected area if told to do so by your health care provider. You may be instructed to scrub the affected skin for several minutes each day.  Do not scratch the affected area of skin.  Avoid hot and humid conditions.  Do not use tanning booths.  Try to avoid sweating a lot. Contact a health care provider if:  Your symptoms get worse.  You have a fever.  You have redness, swelling, or pain at the site of your rash.  You have fluid, blood, or pus coming from your rash.  Your rash returns after treatment. This information is not intended to replace advice given to you by your health care provider. Make  sure you discuss any questions you have with your health care provider. Document Released: 09/14/2000 Document Revised: 05/20/2016 Document Reviewed: 06/29/2014 Elsevier Interactive Patient Education  2018 [employer].   Seborrheic Keratosis Seborrheic keratosis is a common, noncancerous (benign) skin growth. This condition causes waxy, rough, tan, brown, or black spots to appear on the skin. These skin growths can be flat or raised. What are the causes? The cause of this condition is not known. What increases the risk? This condition is more likely to develop in:  People who have a  family history of seborrheic keratosis.  People who are 64 or older.  People who are pregnant.  People who have had estrogen replacement therapy.  What are the signs or symptoms? This condition often occurs on the face, chest, shoulders, back, or other areas. These growths:  Are usually painless, but may become irritated and itchy.  Can be yellow, brown, black, or other colors.  Are slightly raised or have a flat surface.  Are sometimes rough or wart-like in texture.  Are often waxy on the surface.  Are round or oval-shaped.  Sometimes look like they are "stuck on."  Often occur in groups, but may occur as a single growth.  How is this diagnosed? This condition is diagnosed with a medical history and physical exam. A sample of the growth may be tested (skin biopsy). You may need to see a skin specialist (dermatologist). How is this treated? Treatment is not usually needed for this condition, unless the growths are irritated or are often bleeding. You may also choose to have the growths removed if you do not like their appearance. Most commonly, these growths are treated with a procedure in which liquid nitrogen is applied to "freeze" off the growth (cryosurgery). They may also be burned off with electricity or cut off. Follow these instructions at home:  Watch your growth for any changes.  Keep all follow-up visits as told by your health care provider. This is important.  Do not scratch or pick at the growth or growths. This can cause them to become irritated or infected. Contact a health care provider if:  You suddenly have many new growths.  Your growth bleeds, itches, or hurts.  Your growth suddenly becomes larger or changes color. This information is not intended to replace advice given to you by your health care provider. Make sure you discuss any questions you have with your health care provider. Document Released: 10/20/2010 Document Revised: 02/23/2016 Document  Reviewed: 02/02/2015 Elsevier Interactive Patient Education  2018 [employer].

## 2017-10-16 NOTE — Progress Notes (Signed)
Mark Stevens is a 52 y.o. male who presents to [facility] today for rash on back and spot on forehead.  Heriberto has a scaly hyperpigmented non-itchy rash across his back and trunk.  This is been worsening over the past several months but not bothersome.  He has not tried any treatment yet.  No fevers or chills nausea vomiting or diarrhea.  No new cosmetics or medications.  Alecsander also has a small hyperpigmented spot on his right temple forehead region.  This is been present for months and not changing much.  It does not bleed or scale.  He has not tried any treatment yet.   Past Medical History:  Diagnosis Date  . Bipolar 1 disorder ([place])   . Depression   . Hyperlipidemia   . Hypertension    No past surgical history on file. Social History   Tobacco Use  . Smoking status: Never Smoker  . Smokeless tobacco: Never Used  Substance Use Topics  . Alcohol use: No   family history includes Atrial fibrillation in his father; Congestive Heart Failure in his unknown relative; Prostate cancer in his unknown relative.  ROS as above:  Medications: Current Outpatient Medications  Medication Sig Dispense Refill  . atorvastatin (LIPITOR) 10 MG tablet TAKE 1 TABLET EVERY DAY 90 tablet 3  . buPROPion (WELLBUTRIN XL) 300 MG 24 hr tablet Take 1 tablet (300 mg total) by mouth daily. 90 tablet 2  . divalproex (DEPAKOTE ER) 500 MG 24 hr tablet Take by mouth daily.    [name] ipratropium (ATROVENT) 0.06 % nasal spray Place 2 sprays into both nostrils every 4 (four) hours as needed for rhinitis. 10 mL 6  . lisinopril (PRINIVIL,ZESTRIL) 20 MG tablet One tablet by mouth daily for blood pressure control. 90 tablet 2  . metFORMIN (GLUCOPHAGE) 1000 MG tablet One tablet by mouth every evening for blood sugar control. 90 tablet 1   No current facility-administered medications for this visit.      Allergies  Allergen Reactions  . Epinephrine   . Prednisone     Vision loss    Health Maintenance Health Maintenance  Topic Date Due  . FOOT EXAM  01/04/1976  . OPHTHALMOLOGY EXAM  01/04/1976  . HIV Screening  01/03/1981  . PNEUMOCOCCAL POLYSACCHARIDE VACCINE (1) 06/06/2018 (Originally 01/04/1968)  . INFLUENZA VACCINE  06/25/2018 (Originally 05/01/2017)  . HEMOGLOBIN A1C  12/03/2017  . Fecal DNA (Cologuard)  03/02/2019  . TETANUS/TDAP  01/22/2026     Exam:  BP 118/80   Pulse 86   Ht 5\' 10"  (1.778 m)   Wt 230 lb (104.3 kg)   BMI 33.00 kg/m   Wt Readings from Last 5 Encounters:  10/16/17 230 lb (104.3 kg)  09/19/17 225 lb (102.1 kg)  06/05/17 224 lb (101.6 kg)  04/30/17 230 lb (104.3 kg)  01/24/17 229 lb (103.9 kg)    Gen: Well NAD HEENT: EOMI,  MMM Lungs: Normal work of breathing. CTABL Heart: RRR no MRG Abd: NABS, Soft. Nondistended, Nontender Exts: Brisk capillary refill, warm and well perfused.  Skin: Mildly hyperpigmented scaly macular rash in groups of oval-shaped spots coalescing across trunk and back consistent in appearance with tinea versicolor.  See picture below  Additionally patient has a 2-3 mm slightly raised mildly hyperpigmented stuck on appearance lesion at the right forehead temporal region.  This is consistent with seborrheic keratosis.          No  results found for this or any previous visit (from the past 29 hour(s)). No results found.    Assessment and Plan: 52 y.o. male with  Rash on back is consistent with tinea versicolor.  Because he cannot reach this rash easily will use oral antifungals.  Typical dose as fluconazole 300 mg once weekly for 2 weeks.  Skin Lesion on right forehead is seborrheic keratosis. Plan for watchful waiting. If changing next step would be excisional biopsy.     No orders of the defined types were placed in this encounter.  No orders of the defined types were placed in this encounter.    Discussed  warning signs or symptoms. Please see discharge instructions. Patient expresses understanding.  I spent 25 minutes with this patient, greater than 50% was face-to-face time counseling regarding ddx and treatment plan.

## 2017-12-06 ENCOUNTER — Encounter: Payer: Self-pay | Admitting: Physician Assistant

## 2017-12-06 ENCOUNTER — Ambulatory Visit: Payer: 59 | Admitting: Physician Assistant

## 2017-12-06 VITALS — BP 121/68 | HR 84 | Ht 70.0 in | Wt 232.0 lb

## 2017-12-06 DIAGNOSIS — E119 Type 2 diabetes mellitus without complications: Secondary | ICD-10-CM | POA: Diagnosis not present

## 2017-12-06 DIAGNOSIS — Z6833 Body mass index (BMI) 33.0-33.9, adult: Secondary | ICD-10-CM | POA: Diagnosis not present

## 2017-12-06 DIAGNOSIS — Z6831 Body mass index (BMI) 31.0-31.9, adult: Secondary | ICD-10-CM

## 2017-12-06 DIAGNOSIS — E6609 Other obesity due to excess calories: Secondary | ICD-10-CM

## 2017-12-06 DIAGNOSIS — B360 Pityriasis versicolor: Secondary | ICD-10-CM | POA: Diagnosis not present

## 2017-12-06 LAB — POCT GLYCOSYLATED HEMOGLOBIN (HGB A1C): Hemoglobin A1C: 7.2

## 2017-12-06 MED ORDER — DAPAGLIFLOZIN PRO-METFORMIN ER 10-1000 MG PO TB24: 1.0000 | ORAL_TABLET | Freq: Every day | ORAL | 2 refills | Status: DC

## 2017-12-06 MED ORDER — KETOCONAZOLE 2 % EX CREA: 1.0000 "application " | TOPICAL_CREAM | Freq: Two times a day (BID) | CUTANEOUS | 3 refills | Status: DC

## 2017-12-06 NOTE — Progress Notes (Signed)
Subjective:     Patient ID: Mark Stevens, male   DOB: 01/18/1966, 52 y.o.   MRN: [id number]  HPI   Mark Stevens is a 52 year old male with past medical history of HTN, Hyperlipidemia, depression, Bipolar 1, DM (II) presents to the office with his routine follow up visit. Patient's POC A1C was 7.2 during this visit, which increase from 6.3 from last visit. Patient denies any acute change in his health status since his last visit. Patient denies any significant change in his diet or level of physical activity. Patient denies on any new medications or increased stress level. Patient denies fever, chills, night sweats, weight changes. Patient denies chest pain, SOB, palpitation, wheezing, coughing, hemoptysis, nausea, vomiting, diarrhea, constipation.Patient takes metformin 100 mg once a day.  Active Ambulatory Problems    Diagnosis Date Noted  . Sleep disturbance 10/16/2013  . Snoring 10/16/2013  . Bipolar 1 disorder ([place]) 10/16/2013  . Obstructive sleep apnea 12/03/2013  . Essential hypertension 11/02/2014  . Hyperglycemia 01/05/2015  . Type 2 diabetes mellitus ([place]) 02/02/2015  . Poison ivy 05/05/2015  . Medial epicondylitis of elbow, left 10/09/2016  . 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 Nephrolithiasis 06/05/2017  . Tinea versicolor 10/16/2017  . Seborrheic keratoses 10/16/2017  . Class 1 obesity due to excess calories without serious comorbidity with body mass index (BMI) of 33.0 to 33.9 in adult 12/06/2017   Resolved Ambulatory Problems    Diagnosis Date Noted  . No Resolved Ambulatory Problems   Past Medical History:  Diagnosis Date  . Bipolar 1 disorder ([place])   . Depression   . Hyperlipidemia   . Hypertension    Review of Systems   As mentioned in HPI    Objective:   Physical Exam  Constitutional: He is oriented to person, place, and time. He appears well-developed and well-nourished.  Slightly obese male  HENT:  Head: Normocephalic and atraumatic.  Nose: Nose normal.   Eyes: Conjunctivae and EOM are normal. Pupils are equal, round, and reactive to light. Right eye exhibits no discharge. Left eye exhibits no discharge. No scleral icterus.  Neck: Normal range of motion. Neck supple. No tracheal deviation present. No thyromegaly present.  Cardiovascular: Normal rate, regular rhythm, normal heart sounds and intact distal pulses. Exam reveals no gallop and no friction rub.  No murmur heard. Pulmonary/Chest: Effort normal and breath sounds normal. No respiratory distress. He has no wheezes. He has no rales. He exhibits no tenderness.  Musculoskeletal: Normal range of motion. He exhibits no edema, tenderness or deformity.  Neurological: He is alert and oriented to person, place, and time. Coordination normal.  Psychiatric: He has a normal mood and affect. His behavior is normal. Judgment and thought content normal.       Assessment:     .[name] was seen today for hypertension and diabetes.  Diagnoses and all orders for this visit:  Type 2 diabetes mellitus without complication, without long-term current use of insulin (HCC) -     POCT HgB A1C -     Dapagliflozin-metFORMIN HCl ER (XIGDUO XR) 07-999 MG TB24; Take 1 tablet by mouth daily.  Class 1 obesity due to excess calories without serious comorbidity with body mass index (BMI) of 33.0 to 33.9 in adult  Tinea versicolor -     ketoconazole (NIZORAL) 2 % cream; Apply 1 application topically 2 (two) times daily. To affected areas.      Plan:     Patient's A1C was increased from 6.3 (last visit  on 06/05/17) to 7.2 during this visit. Patient denies any change in diet, physical activity, or change in health status. Patient is on 1000 mg metformin once a day. Option to increase metformin 1000 mg  dose to BID is discussed. Patient's BMI is 33.3. Option of combination medication Merleen Nicely is also discuss with patient as it may help lower weight. Potential side effects of the new medicine was discussed with patient.  Patient agreed to proceed with Xigduo. Weight loss goal was set and encouraged patient follow healthy diet and regular physical activity as well. Option of Phentermine to achieve weight loss goal is discussed with patient. Patient may consider it in future if Merleen Nicely does not work, but declined at this time.  Given a cream for fungal infection on skin. Follow up as needed.   [name].Spent 30 minutes with patient and greater than 50 percent of visit spent counseling patient regarding treatment plan.

## 2018-02-06 ENCOUNTER — Other Ambulatory Visit: Payer: Self-pay | Admitting: Physician Assistant

## 2018-02-06 DIAGNOSIS — E119 Type 2 diabetes mellitus without complications: Secondary | ICD-10-CM

## 2018-02-14 ENCOUNTER — Telehealth: Payer: Self-pay | Admitting: Physician Assistant

## 2018-02-14 ENCOUNTER — Other Ambulatory Visit: Payer: Self-pay | Admitting: *Deleted

## 2018-02-14 DIAGNOSIS — E119 Type 2 diabetes mellitus without complications: Secondary | ICD-10-CM

## 2018-02-14 MED ORDER — DAPAGLIFLOZIN PRO-METFORMIN ER 10-1000 MG PO TB24: 1.0000 | ORAL_TABLET | Freq: Every day | ORAL | 0 refills | Status: DC

## 2018-02-14 NOTE — Telephone Encounter (Signed)
90 days with no refill sent to pharmacy.

## 2018-02-14 NOTE — Telephone Encounter (Signed)
Pt called. He is out of his Mark Stevens and he can't get a refill. His pharmacy told him to call his insurance and they're saying he needs to get a 90 day supply (prior to this he was getting a 30 day supply).Marland KitchenMarland Kitchen

## 2018-02-14 NOTE — Telephone Encounter (Signed)
Pt advised.

## 2018-03-04 ENCOUNTER — Telehealth: Payer: Self-pay | Admitting: Physician Assistant

## 2018-03-04 MED ORDER — FLUCONAZOLE 150 MG PO TABS: 300.0000 mg | ORAL_TABLET | ORAL | 1 refills | Status: DC

## 2018-03-04 NOTE — Telephone Encounter (Signed)
Patient called and states that Mark Stevens had gave him a cream for a rash and he can not get it on his back so he is wondering IF you can prescribe a pill form instead and he states that Dr.Corey gave him a pill for it before and he would like to have the pill form again instead of cream. Pt requesting [name] call this in for him and give him a call when you get this done

## 2018-03-04 NOTE — Telephone Encounter (Signed)
Ok sent diflucan to pharmacy for 4 weeks.

## 2018-03-05 NOTE — Telephone Encounter (Signed)
Pt advised that Rx has been sent. 

## 2018-03-07 ENCOUNTER — Ambulatory Visit: Payer: 59 | Admitting: Physician Assistant

## 2018-03-07 ENCOUNTER — Encounter: Payer: Self-pay | Admitting: Physician Assistant

## 2018-03-07 VITALS — BP 108/61 | HR 82 | Ht 70.0 in | Wt 221.0 lb

## 2018-03-07 DIAGNOSIS — G4733 Obstructive sleep apnea (adult) (pediatric): Secondary | ICD-10-CM | POA: Diagnosis not present

## 2018-03-07 DIAGNOSIS — B360 Pityriasis versicolor: Secondary | ICD-10-CM

## 2018-03-07 DIAGNOSIS — E1169 Type 2 diabetes mellitus with other specified complication: Secondary | ICD-10-CM

## 2018-03-07 DIAGNOSIS — I10 Essential (primary) hypertension: Secondary | ICD-10-CM | POA: Diagnosis not present

## 2018-03-07 DIAGNOSIS — E6609 Other obesity due to excess calories: Secondary | ICD-10-CM | POA: Diagnosis not present

## 2018-03-07 DIAGNOSIS — F319 Bipolar disorder, unspecified: Secondary | ICD-10-CM | POA: Diagnosis not present

## 2018-03-07 DIAGNOSIS — Z6831 Body mass index (BMI) 31.0-31.9, adult: Secondary | ICD-10-CM

## 2018-03-07 LAB — POCT GLYCOSYLATED HEMOGLOBIN (HGB A1C): Hemoglobin A1C: 7.3 % — AB (ref 4.0–5.6)

## 2018-03-07 MED ORDER — BUPROPION HCL ER (XL) 300 MG PO TB24: 300.0000 mg | ORAL_TABLET | Freq: Every day | ORAL | 2 refills | Status: DC

## 2018-03-07 MED ORDER — DIVALPROEX SODIUM ER 500 MG PO TB24: 500.0000 mg | ORAL_TABLET | Freq: Every day | ORAL | 3 refills | Status: DC

## 2018-03-07 MED ORDER — SITAGLIPTIN PHOSPHATE 100 MG PO TABS: 100.0000 mg | ORAL_TABLET | Freq: Every day | ORAL | 1 refills | Status: DC

## 2018-03-07 MED ORDER — LISINOPRIL 20 MG PO TABS: ORAL_TABLET | ORAL | 2 refills | Status: DC

## 2018-03-07 NOTE — Progress Notes (Signed)
Subjective:    Patient ID: Mark Stevens, male    DOB: 06/16/1966, 52 y.o.   MRN: [id number]  HPI Pt is a 52 yo male who presents to the clinic for 3 month follow up on DM.   DM- doing well. Not checking sugars. No hypoglycemia. Taking xigduo. Tolerating well. No exercise. Trying to work with portions. He has lost 10lbs in the last 3 months.   Bipolar- very controlled on cureent medication regemien.   HTN- denies any CP, palpitations, headaches. On ACE.   Continues to have fungus/yeast on upper back. Tried cream but not able to get to parts of back. Request pills. They were sent a few days ago. Will see how doing in 2 weeks.   .. Active Ambulatory Problems    Diagnosis Date Noted  . Sleep disturbance 10/16/2013  . Snoring 10/16/2013  . Bipolar 1 disorder ([place]) 10/16/2013  . Obstructive sleep apnea 12/03/2013  . Essential hypertension 11/02/2014  . Hyperglycemia 01/05/2015  . Type 2 diabetes mellitus ([place]) 02/02/2015  . Poison ivy 05/05/2015  . Medial epicondylitis of elbow, left 10/09/2016  . 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 Nephrolithiasis 06/05/2017  . Tinea versicolor 10/16/2017  . Seborrheic keratoses 10/16/2017  . Class 1 obesity due to excess calories with serious comorbidity and body mass index (BMI) of 31.0 to 31.9 in adult 12/06/2017   Resolved Ambulatory Problems    Diagnosis Date Noted  . No Resolved Ambulatory Problems   Past Medical History:  Diagnosis Date  . Bipolar 1 disorder ([place])   . Depression   . Hyperlipidemia   . Hypertension       Review of Systems  All other systems reviewed and are negative.      Objective:   Physical Exam  Constitutional: He is oriented to person, place, and time. He appears well-developed and well-nourished.  HENT:  Head: Normocephalic and atraumatic.  Cardiovascular: Normal rate, regular rhythm and normal heart sounds.  No murmur heard. Pulmonary/Chest: Effort normal and breath sounds normal.  Neurological: He  is alert and oriented to person, place, and time.  Skin:  Diffuse patches of peachy/flesh colored skin that it is scaly on his back.   Psychiatric: He has a normal mood and affect. His behavior is normal.          Assessment & Plan:  .[name] was seen today for diabetes.  Diagnoses and all orders for this visit:  Type 2 diabetes mellitus with other specified complication, without long-term current use of insulin (HCC) -     POCT HgB A1C -     sitaGLIPtin (JANUVIA) 100 MG tablet; Take 1 tablet (100 mg total) by mouth daily. -     Lipid Panel w/reflex Direct LDL -     COMPLETE METABOLIC PANEL WITH GFR  Essential hypertension -     COMPLETE METABOLIC PANEL WITH GFR -     lisinopril (PRINIVIL,ZESTRIL) 20 MG tablet; One tablet by mouth daily for blood pressure control.  Obstructive sleep apnea  Bipolar 1 disorder (HCC) -     buPROPion (WELLBUTRIN XL) 300 MG 24 hr tablet; Take 1 tablet (300 mg total) by mouth daily. -     divalproex (DEPAKOTE ER) 500 MG 24 hr tablet; Take 1 tablet (500 mg total) by mouth daily.  Tinea versicolor  Class 1 obesity due to excess calories with serious comorbidity and body mass index (BMI) of 31.0 to 31.9 in adult   .[name] Depression screen [facility] 2/9 12/06/2017 06/05/2017  Decreased Interest 0 0  Down, Depressed, Hopeless 0 0  PHQ - 2 Score 0 0  Altered sleeping 1 -  Tired, decreased energy 1 -  Change in appetite 0 -  Feeling bad or failure about yourself  1 -  Trouble concentrating 0 -  Moving slowly or fidgety/restless 0 -  Suicidal thoughts 0 -  PHQ-9 Score 3 -  Difficult doing work/chores Somewhat difficult -   ... Lab Results  Component Value Date   HGBA1C 7.3 (A) 03/07/2018   A!C still not to goal. Discussed GLP ! Due to wanting to avoid injections.  Added [place].  On ACE. BP controlled.  On STATIN.  Needs eye exam.  Discussed DM diet and encouraged exercise.   contnue oral diflucan to fungus on skin. If not needs to go to  dermatologist.

## 2018-03-08 ENCOUNTER — Encounter: Payer: Self-pay | Admitting: Physician Assistant

## 2018-03-19 ENCOUNTER — Encounter: Payer: Self-pay | Admitting: Physician Assistant

## 2018-03-19 DIAGNOSIS — E781 Pure hyperglyceridemia: Secondary | ICD-10-CM | POA: Insufficient documentation

## 2018-03-19 NOTE — Progress Notes (Signed)
Call pt: LDL to goal! Way to go. TG are up. Are you on fish oil? If not please start 4000mg  daily. Kidney, liver look great.

## 2018-04-08 ENCOUNTER — Other Ambulatory Visit: Payer: Self-pay

## 2018-04-08 ENCOUNTER — Telehealth: Payer: Self-pay | Admitting: Physician Assistant

## 2018-04-08 DIAGNOSIS — E119 Type 2 diabetes mellitus without complications: Secondary | ICD-10-CM

## 2018-04-08 MED ORDER — DAPAGLIFLOZIN PRO-METFORMIN ER 10-1000 MG PO TB24: 1.0000 | ORAL_TABLET | Freq: Every day | ORAL | 0 refills | Status: DC

## 2018-04-08 NOTE — Telephone Encounter (Signed)
Spoke to Patient. He is getting these Rx's from his specialist. Removed from Rx list, and added from abstraction Rx list. Updated so dose and frequency are correct. No further concerns.   Called pharmacy and cancelled the two Rx's that we sent for Pt.

## 2018-04-08 NOTE — Telephone Encounter (Signed)
Pt called. He says the medication he's on - Depakote and Divalpoex  were prescribed by another Dr outside of [facility] and so he doesn't understand why it was sent to his pharmacy and the dosage is also wrong.

## 2018-04-29 ENCOUNTER — Emergency Department
Admission: EM | Admit: 2018-04-29 | Discharge: 2018-04-29 | Disposition: A | Discharge status: Home / Self Care | Payer: 59 | Source: Home / Self Care

## 2018-04-29 ENCOUNTER — Other Ambulatory Visit: Payer: Self-pay

## 2018-04-29 DIAGNOSIS — H1032 Unspecified acute conjunctivitis, left eye: Secondary | ICD-10-CM

## 2018-04-29 DIAGNOSIS — B360 Pityriasis versicolor: Secondary | ICD-10-CM

## 2018-04-29 MED ORDER — FLUCONAZOLE 200 MG PO TABS: 200.0000 mg | ORAL_TABLET | Freq: Every day | ORAL | 0 refills | Status: AC

## 2018-04-29 MED ORDER — CIPROFLOXACIN HCL 0.3 % OP SOLN: 1.0000 [drp] | OPHTHALMIC | 0 refills | Status: DC

## 2018-04-29 NOTE — ED Triage Notes (Signed)
Last few days, pt feels like something is in his eye.  Eye has been watery, but no crustiness. Pt has had red splotches on back mostly when getting out of the shower.

## 2018-04-29 NOTE — Discharge Instructions (Signed)
Try using selsun blue shampoo for quickest result with no side effects for the rash on your back.  I will re prescribe diflucan oral

## 2018-04-30 NOTE — ED Provider Notes (Signed)
[name]    CSN: [id number] Arrival date & time: 04/29/18  1502     History   Chief Complaint Chief Complaint  Patient presents with  . Rash  . Eye Drainage    HPI Mark Stevens is a 52 y.o. male.   The history is provided by the patient. No language interpreter was used.  Rash  Location:  Torso Severity:  Mild Progression:  Unchanged Relieved by:  Nothing Worsened by:  Nothing Ineffective treatments:  None tried Conjunctivitis  This is a new problem.    Past Medical History:  Diagnosis Date  . Bipolar 1 disorder ([place])   . Depression   . Hyperlipidemia   . Hypertension     Patient Active Problem List   Diagnosis Date Noted  . Hypertriglyceridemia 03/19/2018  . Class 1 obesity due to excess calories with serious comorbidity and body mass index (BMI) of 31.0 to 31.9 in adult 12/06/2017  . Tinea versicolor 10/16/2017  . Seborrheic keratoses 10/16/2017  . Nephrolithiasis 06/05/2017  . 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 Medial epicondylitis of elbow, left 10/09/2016  . Poison ivy 05/05/2015  . Type 2 diabetes mellitus ([place]) 02/02/2015  . Hyperglycemia 01/05/2015  . Essential hypertension 11/02/2014  . Obstructive sleep apnea 12/03/2013  . Sleep disturbance 10/16/2013  . Snoring 10/16/2013  . Bipolar 1 disorder ([place]) 10/16/2013    History reviewed. No pertinent surgical history.     Home Medications    Prior to Admission medications   Medication Sig Start Date End Date Taking? Authorizing Provider  atorvastatin (LIPITOR) 10 MG tablet TAKE 1 TABLET EVERY DAY 07/23/17   Breeback, [name] L, PA-C  buPROPion (WELLBUTRIN XL) 300 MG 24 hr tablet Take by mouth.    [provider]  ciprofloxacin (CILOXAN) 0.3 % ophthalmic solution Place 1 drop into the left eye every 2 (two) hours while awake. Administer 1 drop, every 2 hours, while awake, for 2 days. Then 1 drop, every 4 hours, while awake, for the next 5 days. 04/29/18   Fransico Meadow,  PA-C  Dapagliflozin-metFORMIN HCl ER (XIGDUO XR) 07-999 MG TB24 Take 1 tablet by mouth daily. 04/08/18   Breeback, [name] L, PA-C  divalproex (DEPAKOTE ER) 500 MG 24 hr tablet Take by mouth 2 (two) times daily.    [provider]  fluconazole (DIFLUCAN) 200 MG tablet Take 1 tablet (200 mg total) by mouth daily for 7 days. 04/29/18 05/06/18  Fransico Meadow, PA-C  ketoconazole (NIZORAL) 2 % cream Apply 1 application topically 2 (two) times daily. To affected areas. 12/06/17   Breeback, [name] L, PA-C  lisinopril (PRINIVIL,ZESTRIL) 20 MG tablet One tablet by mouth daily for blood pressure control. 03/07/18 03/07/19  Breeback, Luvenia Starch L, PA-C  sitaGLIPtin (JANUVIA) 100 MG tablet Take 1 tablet (100 mg total) by mouth daily. 03/07/18   Donella Stade, PA-C    Family History Family History  Problem Relation Age of Onset  . Prostate cancer Unknown        uncle  . Atrial fibrillation Father   . Congestive Heart Failure Unknown     Social History Social History   Tobacco Use  . Smoking status: Never Smoker  . Smokeless tobacco: Never Used  Substance Use Topics  . Alcohol use: No  . Drug use: No     Allergies   Epinephrine and Prednisone   Review of Systems Review of Systems  Eyes: Positive for discharge.  Skin: Positive for rash.  All other systems  reviewed and are negative.    Physical Exam Triage Vital Signs ED Triage Vitals  Enc Vitals Group     BP 04/29/18 1525 111/78     Pulse Rate 04/29/18 1525 94     Resp --      Temp 04/29/18 1525 97.7 F (36.5 C)     Temp Source 04/29/18 1525 Oral     SpO2 04/29/18 1525 96 %     Weight 04/29/18 1525 222 lb (100.7 kg)     Height 04/29/18 1525 5\' 10"  (1.778 m)     Head Circumference --      Peak Flow --      Pain Score 04/29/18 1609 0     Pain Loc --      Pain Edu? --      Excl. in [place]? --    No data found.  Updated Vital Signs BP 111/78 (BP Location: Right Arm)   Pulse 94   Temp 97.7 F (36.5 C) (Oral)   Ht 5\' 10"  (1.778 m)    Wt 222 lb (100.7 kg)   SpO2 96%   BMI 31.85 kg/m   Visual Acuity Right Eye Distance: 20/20 Left Eye Distance: 20/30 Bilateral Distance: 20/20  Right Eye Near:   Left Eye Near:    Bilateral Near:     Physical Exam  Constitutional: He appears well-developed and well-nourished.  HENT:  Head: Normocephalic.  Eyes: Pupils are equal, round, and reactive to light. EOM are normal. Left eye exhibits discharge.  No fluro uptake no foreign body,    Cardiovascular: Normal rate.  Pulmonary/Chest: Effort normal.  Abdominal: Soft.  Neurological: He is alert.  Skin: Rash noted.  Psychiatric: He has a normal mood and affect.  Nursing note and vitals reviewed.    UC Treatments / Results  Labs (all labs ordered are listed, but only abnormal results are displayed) Labs Reviewed - No data to display  EKG None  Radiology No results found.  Procedures Procedures (including critical care time)  Medications Ordered in UC Medications - No data to display  Initial Impression / Assessment and Plan / UC Course  I have reviewed the triage vital signs and the nursing notes.  Pertinent labs & imaging results that were available during my care of the patient were reviewed by me and considered in my medical decision making (see chart for details).     Pt has been diagnosed with tinea versicolor  Pt advised selsun shampoo. Refill of diflucan.  I will cover for conjunctiviits.  Pt advised he may need to follow up with opthomology if symptoms persist after 2 days of drops  Final Clinical Impressions(s) / UC Diagnoses   Final diagnoses:  Tinea versicolor  Acute conjunctivitis of left eye, unspecified acute conjunctivitis type     Discharge Instructions     Try using selsun blue shampoo for quickest result with no side effects for the rash on your back.  I will re prescribe diflucan oral    ED Prescriptions    Medication Sig Dispense Auth. Provider   fluconazole (DIFLUCAN) 200 MG  tablet Take 1 tablet (200 mg total) by mouth daily for 7 days. 7 tablet Jaydi Bray K, PA-C   ciprofloxacin (CILOXAN) 0.3 % ophthalmic solution Place 1 drop into the left eye every 2 (two) hours while awake. Administer 1 drop, every 2 hours, while awake, for 2 days. Then 1 drop, every 4 hours, while awake, for the next 5 days. 5 mL Fransico Meadow,  PA-C     Controlled Substance Prescriptions [place] Controlled Substance Registry consulted? Not Applicable   Fransico Meadow, [place] 04/30/18 3614

## 2018-06-13 ENCOUNTER — Ambulatory Visit: Payer: 59 | Admitting: Physician Assistant

## 2018-06-13 ENCOUNTER — Encounter: Payer: Self-pay | Admitting: Physician Assistant

## 2018-06-13 VITALS — BP 106/68 | HR 87 | Ht 70.0 in | Wt 217.0 lb

## 2018-06-13 DIAGNOSIS — E119 Type 2 diabetes mellitus without complications: Secondary | ICD-10-CM

## 2018-06-13 LAB — POCT GLYCOSYLATED HEMOGLOBIN (HGB A1C): Hemoglobin A1C: 6 % — AB (ref 4.0–5.6)

## 2018-06-13 MED ORDER — DAPAGLIFLOZIN PRO-METFORMIN ER 10-1000 MG PO TB24: 1.0000 | ORAL_TABLET | Freq: Every day | ORAL | 0 refills | Status: DC

## 2018-06-13 MED ORDER — SITAGLIPTIN PHOSPHATE 100 MG PO TABS: 100.0000 mg | ORAL_TABLET | Freq: Every day | ORAL | 1 refills | Status: DC

## 2018-06-13 NOTE — Progress Notes (Signed)
   Subjective:    Patient ID: Mark Stevens, male    DOB: 1966-04-06, 52 y.o.   MRN: [id number]  HPI Pt is a 52 male with HTN, OSA, HLD, T2DM who presents to the clinic for follow up.   DM- pt is not checking sugars. He feels much better. Started xigduo and doing great. He is urinating a little more. Continues on [place] as well. No hypoglycemia. No open sores or wounds. Does not watch his diet or exercise. Has lost another 5lbs.   Pt denies any CP, palpitations, headaches or vision changes.  .. Active Ambulatory Problems    Diagnosis Date Noted  . Sleep disturbance 10/16/2013  . Snoring 10/16/2013  . Bipolar 1 disorder ([place]) 10/16/2013  . Obstructive sleep apnea 12/03/2013  . Essential hypertension 11/02/2014  . Hyperglycemia 01/05/2015  . Type 2 diabetes mellitus ([place]) 02/02/2015  . Poison ivy 05/05/2015  . Medial epicondylitis of elbow, left 10/09/2016  . 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 Nephrolithiasis 06/05/2017  . Tinea versicolor 10/16/2017  . Seborrheic keratoses 10/16/2017  . Class 1 obesity due to excess calories with serious comorbidity and body mass index (BMI) of 31.0 to 31.9 in adult 12/06/2017  . Hypertriglyceridemia 03/19/2018   Resolved Ambulatory Problems    Diagnosis Date Noted  . No Resolved Ambulatory Problems   Past Medical History:  Diagnosis Date  . Depression   . Hyperlipidemia   . Hypertension        Review of Systems  All other systems reviewed and are negative.      Objective:   Physical Exam  Constitutional: He is oriented to person, place, and time. He appears well-developed and well-nourished.  HENT:  Head: Normocephalic and atraumatic.  Cardiovascular: Normal rate and regular rhythm.  Pulmonary/Chest: Effort normal and breath sounds normal.  Neurological: He is alert and oriented to person, place, and time.  Psychiatric: He has a normal mood and affect. His behavior is normal.          Assessment & Plan:  Marland KitchenMarland KitchenDiagnoses and  all orders for this visit:  Type 2 diabetes mellitus without complication, without long-term current use of insulin (HCC) -     POCT glycosylated hemoglobin (Hb A1C) -     sitaGLIPtin (JANUVIA) 100 MG tablet; Take 1 tablet (100 mg total) by mouth daily. -     Dapagliflozin-metFORMIN HCl ER (XIGDUO XR) 07-999 MG TB24; Take 1 tablet by mouth daily.   .. Lab Results  Component Value Date   HGBA1C 6.0 (A) 06/13/2018   Doing great. Much improvement.  Stay on same dose.  On ACE. BP controlled On STATIN.  Needs eye exam. Pt aware.  Pt declines flu and pneumonia vaccine. Aware of risk.  Discussed diet changes and continued weight loss to get out of obesity criteria.

## 2018-06-13 NOTE — Patient Instructions (Signed)

## 2018-07-01 LAB — COMPLETE METABOLIC PANEL WITH GFR
AG Ratio: 1.8 (calc) (ref 1.0–2.5)
ALT: 20 U/L (ref 9–46)
AST: 17 U/L (ref 10–35)
Albumin: 4.4 g/dL (ref 3.6–5.1)
Alkaline phosphatase (APISO): 68 U/L (ref 40–115)
BUN: 16 mg/dL (ref 7–25)
CO2: 26 mmol/L (ref 20–32)
Calcium: 10 mg/dL (ref 8.6–10.3)
Chloride: 101 mmol/L (ref 98–110)
Creat: 1.27 mg/dL (ref 0.70–1.33)
GFR, Est African American: 75 mL/min/{1.73_m2} (ref >=60)
GFR, Est Non African American: 65 mL/min/{1.73_m2} (ref >=60)
Globulin: 2.5 g/dL (calc) (ref 1.9–3.7)
Glucose, Bld: 140 mg/dL — ABNORMAL HIGH (ref 65–99)
Potassium: 4.5 mmol/L (ref 3.5–5.3)
Sodium: 137 mmol/L (ref 135–146)
Total Bilirubin: 0.5 mg/dL (ref 0.2–1.2)
Total Protein: 6.9 g/dL (ref 6.1–8.1)

## 2018-07-01 LAB — LIPID PANEL W/REFLEX DIRECT LDL
Cholesterol: 142 mg/dL (ref <200)
HDL: 41 mg/dL (ref >40)
LDL Cholesterol (Calc): 66 mg/dL (calc)
Non-HDL Cholesterol (Calc): 101 mg/dL (calc) (ref <130)
Total CHOL/HDL Ratio: 3.5 (calc) (ref <5.0)
Triglycerides: 265 mg/dL — ABNORMAL HIGH (ref <150)

## 2018-08-11 ENCOUNTER — Other Ambulatory Visit: Payer: Self-pay | Admitting: Physician Assistant

## 2018-09-04 ENCOUNTER — Ambulatory Visit: Payer: 59 | Admitting: Family Medicine

## 2018-09-04 ENCOUNTER — Encounter: Payer: Self-pay | Admitting: Family Medicine

## 2018-09-04 VITALS — BP 137/82 | Ht 70.0 in | Wt 218.0 lb

## 2018-09-04 DIAGNOSIS — J0101 Acute recurrent maxillary sinusitis: Secondary | ICD-10-CM

## 2018-09-04 DIAGNOSIS — L821 Other seborrheic keratosis: Secondary | ICD-10-CM | POA: Diagnosis not present

## 2018-09-04 MED ORDER — AZITHROMYCIN 250 MG PO TABS: 250.0000 mg | ORAL_TABLET | Freq: Every day | ORAL | 0 refills | Status: DC

## 2018-09-04 NOTE — Patient Instructions (Signed)
Thank you for coming in today. For sinus try over the counter medicine.  Take azithromycin (zpac) Recheck with [name] for your regular care.   For skin tag it should blister and fall off in a few days.  You should have normal skin after.

## 2018-09-04 NOTE — Progress Notes (Signed)
Mark Stevens is a 52 y.o. male who presents to [facility] today for sinus congestion pressure runny nose.  Symptoms present for a few days.  Symptoms are consistent prior history of sinusitis.  Patient is tried some over-the-counter medications which have not helped much.  Notes his sinus pressure has developed and facial pain over the last day.  Additionally patient continues to have a scaly lesion on his right forehead.  This was evaluated earlier this year and thought to be seborrheic keratosis.  He notes is become irritated and he scratched it.  He is interested in liquid nitrogen cryotherapy if possible.   ROS as above:  Exam:  BP 137/82   Ht 5\' 10"  (1.778 m)   Wt 218 lb (98.9 kg)   BMI 31.28 kg/m  Wt Readings from Last 5 Encounters:  09/04/18 218 lb (98.9 kg)  06/13/18 217 lb (98.4 kg)  04/29/18 222 lb (100.7 kg)  03/07/18 221 lb (100.2 kg)  12/06/17 232 lb (105.2 kg)    Gen: Well NAD HEENT: EOMI,  MMM clear nasal discharge with inflamed nasal turbinates.  Tender palpation bilateral maxillary sinuses.  Normal tympanic membranes.  Normal posterior pharynx. Lungs: Normal work of breathing. CTABL Heart: RRR no MRG Abd: NABS, Soft. Nondistended, Nontender Exts: Brisk capillary refill, warm and well perfused.  Skin: Right forehead 3 mm small crusted lesion irritated appearing.  Cryotherapy: Liquid nitrogen treatment.  Consent obtained timeout performed. Small lesion right forehead treated with liquid nitrogen jet to achieve Frost circle for 10 seconds.  Repeated x3.   Assessment and Plan: 52 y.o. male with sinusitis likely bacterial conversion.  This is a recurrent issue for patient.  Plan to treat with azithromycin.  Check with PCP as needed  Skin lesion: Appears irritated seborrheic keratosis.  Treated with liquid nitrogen.  Recheck as  needed.   No orders of the defined types were placed in this encounter.  Meds ordered this encounter  Medications  . azithromycin (ZITHROMAX) 250 MG tablet    Sig: Take 1 tablet (250 mg total) by mouth daily. Take first 2 tablets together, then 1 every day until finished.    Dispense:  6 tablet    Refill:  0     Historical information moved to improve visibility of documentation.  Past Medical History:  Diagnosis Date  . Bipolar 1 disorder ([place])   . Depression   . Hyperlipidemia   . Hypertension    No past surgical history on file. Social History   Tobacco Use  . Smoking status: Never Smoker  . Smokeless tobacco: Never Used  Substance Use Topics  . Alcohol use: No   family history includes Atrial fibrillation in his father; Congestive Heart Failure in his unknown relative; Prostate cancer in his unknown relative.  Medications: Current Outpatient Medications  Medication Sig Dispense Refill  . atorvastatin (LIPITOR) 10 MG tablet TAKE 1 TABLET EVERY DAY 90 tablet 0  . buPROPion (WELLBUTRIN XL) 300 MG 24 hr tablet Take by mouth.    . ciprofloxacin (CILOXAN) 0.3 % ophthalmic solution Place 1 drop into the left eye every 2 (two) hours while awake. Administer 1 drop, every 2 hours, while awake, for 2 days. Then 1 drop, every 4 hours, while awake, for the next 5 days. 5 mL 0  . Dapagliflozin-metFORMIN HCl ER (XIGDUO XR) 07-999 MG TB24 Take 1 tablet by mouth daily. 90 tablet 0  . divalproex (DEPAKOTE ER)  500 MG 24 hr tablet Take by mouth 2 (two) times daily.    [name] ketoconazole (NIZORAL) 2 % cream Apply 1 application topically 2 (two) times daily. To affected areas. 60 g 3  . lisinopril (PRINIVIL,ZESTRIL) 20 MG tablet One tablet by mouth daily for blood pressure control. 90 tablet 2  . sitaGLIPtin (JANUVIA) 100 MG tablet Take 1 tablet (100 mg total) by mouth daily. 90 tablet 1  . azithromycin (ZITHROMAX) 250 MG tablet Take 1 tablet (250 mg total) by mouth daily. Take first 2 tablets  together, then 1 every day until finished. 6 tablet 0   No current facility-administered medications for this visit.    Allergies  Allergen Reactions  . Epinephrine   . Prednisone     Vision loss     Discussed warning signs or symptoms. Please see discharge instructions. Patient expresses understanding.

## 2018-09-05 ENCOUNTER — Encounter: Payer: Self-pay | Admitting: Physician Assistant

## 2018-09-05 ENCOUNTER — Ambulatory Visit: Payer: 59 | Admitting: Physician Assistant

## 2018-09-05 VITALS — BP 114/87 | HR 90 | Ht 70.0 in | Wt 217.0 lb

## 2018-09-05 DIAGNOSIS — E119 Type 2 diabetes mellitus without complications: Secondary | ICD-10-CM | POA: Diagnosis not present

## 2018-09-05 DIAGNOSIS — I10 Essential (primary) hypertension: Secondary | ICD-10-CM | POA: Diagnosis not present

## 2018-09-05 LAB — POCT GLYCOSYLATED HEMOGLOBIN (HGB A1C): Hemoglobin A1C: 5.8 % — AB (ref 4.0–5.6)

## 2018-09-05 MED ORDER — SITAGLIPTIN PHOSPHATE 100 MG PO TABS: 100.0000 mg | ORAL_TABLET | Freq: Every day | ORAL | 1 refills | Status: DC

## 2018-09-05 MED ORDER — DAPAGLIFLOZIN PRO-METFORMIN ER 10-1000 MG PO TB24: 1.0000 | ORAL_TABLET | Freq: Every day | ORAL | 0 refills | Status: DC

## 2018-09-05 MED ORDER — BUPROPION HCL ER (XL) 300 MG PO TB24: 300.0000 mg | ORAL_TABLET | Freq: Every day | ORAL | 1 refills | Status: DC

## 2018-09-05 MED ORDER — LISINOPRIL 20 MG PO TABS: ORAL_TABLET | ORAL | 2 refills | Status: DC

## 2018-09-05 MED ORDER — ATORVASTATIN CALCIUM 10 MG PO TABS: 10.0000 mg | ORAL_TABLET | Freq: Every day | ORAL | 3 refills | Status: DC

## 2018-09-05 NOTE — Progress Notes (Addendum)
Subjective:    Patient ID: Mark Stevens, male    DOB: May 03, 1966, 52 y.o.   MRN: [id number]  HPI Patient is a 52 year old male with a hx of T2DM, HTN, OSA, HLD and bipolar 1 disorder is presenting to clinic today for a 3 month follow up. He reports doing well with Xigduo and Januvia. He does report some urinary frequency, but he states that it is not too bothersome. He reports that Celesta Gentile is costing a lot of money at the pharmacy and is interested to see if there are cheaper alternatives.  He denies open wounds or ulcers on his feet. He also denies CP, palpitations, headaches, vision changes, and does not check sugars at home. He reports a poor diet and does not exercise. He declines flu and pneumonia vaccines today. He has not had an eye exam. He has no other concerns today.  .. Active Ambulatory Problems    Diagnosis Date Noted  . Sleep disturbance 10/16/2013  . Snoring 10/16/2013  . Bipolar 1 disorder ([place]) 10/16/2013  . Obstructive sleep apnea 12/03/2013  . Essential hypertension 11/02/2014  . Hyperglycemia 01/05/2015  . Type 2 diabetes mellitus ([place]) 02/02/2015  . Poison ivy 05/05/2015  . Medial epicondylitis of elbow, left 10/09/2016  . 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 Nephrolithiasis 06/05/2017  . Tinea versicolor 10/16/2017  . Seborrheic keratoses 10/16/2017  . Class 1 obesity due to excess calories with serious comorbidity and body mass index (BMI) of 31.0 to 31.9 in adult 12/06/2017  . Hypertriglyceridemia 03/19/2018   Resolved Ambulatory Problems    Diagnosis Date Noted  . No Resolved Ambulatory Problems   Past Medical History:  Diagnosis Date  . Depression   . Hyperlipidemia   . Hypertension       Review of Systems  Constitutional: Negative for chills, fatigue and fever.  Respiratory: Negative for cough and shortness of breath.   Cardiovascular: Negative for chest pain, palpitations and leg swelling.  Genitourinary: Positive for frequency. Negative for  decreased urine volume, difficulty urinating, dysuria, flank pain and urgency.       Objective:   Physical Exam  Constitutional: He appears well-developed and well-nourished. No distress.  Cardiovascular: Normal rate and regular rhythm. Exam reveals no gallop and no friction rub.  No murmur heard. Pulmonary/Chest: Effort normal and breath sounds normal. No respiratory distress.  Psychiatric: He has a normal mood and affect. His behavior is normal. Judgment and thought content normal.          Assessment & Plan:  Marland KitchenMarland KitchenDiagnoses and all orders for this visit:  Type 2 diabetes mellitus without complication, without long-term current use of insulin (HCC) -     POCT glycosylated hemoglobin (Hb A1C) -     sitaGLIPtin (JANUVIA) 100 MG tablet; Take 1 tablet (100 mg total) by mouth daily. -     Dapagliflozin-metFORMIN HCl ER (XIGDUO XR) 07-999 MG TB24; Take 1 tablet by mouth daily.  Essential hypertension -     lisinopril (PRINIVIL,ZESTRIL) 20 MG tablet; One tablet by mouth daily for blood pressure control.  Other orders -     atorvastatin (LIPITOR) 10 MG tablet; Take 1 tablet (10 mg total) by mouth daily. -     buPROPion (WELLBUTRIN XL) 300 MG 24 hr tablet; Take 1 tablet (300 mg total) by mouth daily.   .. Lab Results  Component Value Date   HGBA1C 5.8 (A) 09/05/2018     Diabetes: On ACE and statin. A1C is great today. Continue same  dose Xigduo and Januvia. Gave coupon card for Januvia. Discussed discontinuing Januvia but patient wishes to stay on same regimen for fear of spiking sugars. Foot exam was normal today. Patient encouraged to get an eye exam, eat good diet and exercise. Follow up in 6 months, or sooner if symptoms warrant.  Marland KitchenVernetta Honey PA-C, have reviewed and agree with the above documentation in it's entirety.

## 2018-09-08 ENCOUNTER — Encounter: Payer: Self-pay | Admitting: Family Medicine

## 2018-09-08 ENCOUNTER — Ambulatory Visit: Payer: 59 | Admitting: Family Medicine

## 2018-09-08 ENCOUNTER — Ambulatory Visit: Payer: 59 | Admitting: Physician Assistant

## 2018-09-08 VITALS — BP 104/79 | HR 104 | Temp 99.1°F | Ht 70.0 in | Wt 214.0 lb

## 2018-09-08 DIAGNOSIS — J029 Acute pharyngitis, unspecified: Secondary | ICD-10-CM

## 2018-09-08 DIAGNOSIS — J028 Acute pharyngitis due to other specified organisms: Secondary | ICD-10-CM | POA: Diagnosis not present

## 2018-09-08 MED ORDER — IPRATROPIUM BROMIDE 0.06 % NA SOLN: 2.0000 | NASAL | 6 refills | Status: DC | PRN

## 2018-09-08 MED ORDER — CEFDINIR 300 MG PO CAPS: 300.0000 mg | ORAL_CAPSULE | Freq: Two times a day (BID) | ORAL | 0 refills | Status: DC

## 2018-09-08 NOTE — Progress Notes (Signed)
Mark Stevens is a 52 y.o. male who presents to [facility] today for sore throat.  Kerim was was seen about a week ago for sinus pain and pressure and congestion thought to be due to sinusitis.  He has a history of recurrent bacterial sinus infections.  He was treated with azithromycin which usually works quite well.  He notes this is helped his sinus congestion but he is developed significant sore throat and hoarseness in his voice.  This is been present for about 3 days now.  He denies any vomiting diarrhea chest pain or palpitations.  He denies trouble breathing.  He notes he is intolerant or allergic to prednisone.  He is tried some over-the-counter medications which have helped a bit.  He has recently finished his 5-day course of azithromycin.   ROS as above:  Exam:  BP 104/79   Pulse (!) 104   Temp 99.1 F (37.3 C) (Oral)   Ht 5\' 10"  (1.778 m)   Wt 214 lb (97.1 kg)   BMI 30.71 kg/m  Wt Readings from Last 5 Encounters:  09/08/18 214 lb (97.1 kg)  09/05/18 217 lb (98.4 kg)  09/04/18 218 lb (98.9 kg)  06/13/18 217 lb (98.4 kg)  04/29/18 222 lb (100.7 kg)    Gen: Well NAD HEENT: EOMI,  MMM posterior pharynx is erythematous without exudate.  Mild cervical lymphadenopathy present bilaterally.  Clear nasal discharge with mildly inflamed nasal turbinates bilaterally.  Maxillary frontal sinus nontender. Lungs: Normal work of breathing. CTABL Heart: RRR no MRG Abd: NABS, Soft. Nondistended, Nontender Exts: Brisk capillary refill, warm and well perfused.      Assessment and Plan: 52 y.o. male with pharyngitis.  Viral versus bacterial.  Likely more bacterial.  Patient has a sensitive gag reflex and also is recently been exposed to antibiotics therefore I do not see the utility in proceeding with rapid strep test.  Treat empirically with Omnicef and  over-the-counter medications.  Recheck if not improving.   Orders Placed This Encounter  Procedures  . POCT rapid strep A   No orders of the defined types were placed in this encounter.    Historical information moved to improve visibility of documentation.  Past Medical History:  Diagnosis Date  . Bipolar 1 disorder ([place])   . Depression   . Hyperlipidemia   . Hypertension    No past surgical history on file. Social History   Tobacco Use  . Smoking status: Never Smoker  . Smokeless tobacco: Never Used  Substance Use Topics  . Alcohol use: No   family history includes Atrial fibrillation in his father; Congestive Heart Failure in his unknown relative; Prostate cancer in his unknown relative.  Medications: Current Outpatient Medications  Medication Sig Dispense Refill  . atorvastatin (LIPITOR) 10 MG tablet Take 1 tablet (10 mg total) by mouth daily. 90 tablet 3  . azithromycin (ZITHROMAX) 250 MG tablet Take 1 tablet (250 mg total) by mouth daily. Take first 2 tablets together, then 1 every day until finished. 6 tablet 0  . buPROPion (WELLBUTRIN XL) 300 MG 24 hr tablet Take 1 tablet (300 mg total) by mouth daily. 90 tablet 1  . ciprofloxacin (CILOXAN) 0.3 % ophthalmic solution Place 1 drop into the left eye every 2 (two) hours while awake. Administer 1 drop, every 2 hours, while awake, for 2 days. Then 1 drop, every 4 hours, while awake, for the next 5 days. 5  mL 0  . Dapagliflozin-metFORMIN HCl ER (XIGDUO XR) 07-999 MG TB24 Take 1 tablet by mouth daily. 90 tablet 0  . divalproex (DEPAKOTE ER) 500 MG 24 hr tablet Take by mouth 2 (two) times daily.    [name] ketoconazole (NIZORAL) 2 % cream Apply 1 application topically 2 (two) times daily. To affected areas. 60 g 3  . lisinopril (PRINIVIL,ZESTRIL) 20 MG tablet One tablet by mouth daily for blood pressure control. 90 tablet 2  . sitaGLIPtin (JANUVIA) 100 MG tablet Take 1 tablet (100 mg total) by mouth daily. 90 tablet 1   No  current facility-administered medications for this visit.    Allergies  Allergen Reactions  . Epinephrine   . Prednisone     Vision loss     Discussed warning signs or symptoms. Please see discharge instructions. Patient expresses understanding.

## 2018-09-08 NOTE — Patient Instructions (Addendum)
Thank you for coming in today. Take omnief twice daily for 1 week.  Use atrovent nasal spray for post nasal drainage.  Continue over the counter medicine for pain and discomfort as needed.   Call or go to the emergency room if you get worse, have trouble breathing, have chest pains, or palpitations.    Pharyngitis Pharyngitis is a sore throat (pharynx). There is redness, pain, and swelling of your throat. Follow these instructions at home:  Drink enough fluids to keep your pee (urine) clear or pale yellow.  Only take medicine as told by your doctor. ? You may get sick again if you do not take medicine as told. Finish your medicines, even if you start to feel better. ? Do not take aspirin.  Rest.  Rinse your mouth (gargle) with salt water ( tsp of salt per 1 qt of water) every 1-2 hours. This will help the pain.  If you are not at risk for choking, you can suck on hard candy or sore throat lozenges. Contact a doctor if:  You have large, tender lumps on your neck.  You have a rash.  You cough up green, yellow-brown, or bloody spit. Get help right away if:  You have a stiff neck.  You drool or cannot swallow liquids.  You throw up (vomit) or are not able to keep medicine or liquids down.  You have very bad pain that does not go away with medicine.  You have problems breathing (not from a stuffy nose). This information is not intended to replace advice given to you by your health care provider. Make sure you discuss any questions you have with your health care provider. Document Released: 03/05/2008 Document Revised: 02/23/2016 Document Reviewed: 05/25/2013 Elsevier Interactive Patient Education  2017 [employer].

## 2018-09-12 ENCOUNTER — Ambulatory Visit: Payer: 59 | Admitting: Physician Assistant

## 2018-10-13 ENCOUNTER — Telehealth: Payer: Self-pay | Admitting: Physician Assistant

## 2018-10-13 NOTE — Telephone Encounter (Signed)
Pt last evaluated by Dr Georgina Snell, routing for review.

## 2018-10-13 NOTE — Telephone Encounter (Signed)
[name]: Pt called. He was seen on 12/5 for a sinus infection and he still has this issue. He wants to know if you can call in a z-pak for him? Thanks

## 2018-10-14 MED ORDER — AZITHROMYCIN 250 MG PO TABS: 250.0000 mg | ORAL_TABLET | Freq: Every day | ORAL | 0 refills | Status: DC

## 2018-10-14 NOTE — Telephone Encounter (Signed)
Left VM with update.  

## 2018-10-14 NOTE — Telephone Encounter (Signed)
Thanks

## 2018-10-14 NOTE — Telephone Encounter (Signed)
Azithromycin sent to pharmacy

## 2019-02-26 ENCOUNTER — Other Ambulatory Visit: Payer: Self-pay | Admitting: Physician Assistant

## 2019-02-26 DIAGNOSIS — E119 Type 2 diabetes mellitus without complications: Secondary | ICD-10-CM

## 2019-03-06 ENCOUNTER — Encounter: Payer: Self-pay | Admitting: Physician Assistant

## 2019-03-06 ENCOUNTER — Ambulatory Visit: Payer: 59 | Admitting: Physician Assistant

## 2019-03-06 ENCOUNTER — Other Ambulatory Visit: Payer: Self-pay

## 2019-03-06 VITALS — BP 109/72 | HR 92 | Ht 70.0 in | Wt 216.0 lb

## 2019-03-06 DIAGNOSIS — E782 Mixed hyperlipidemia: Secondary | ICD-10-CM

## 2019-03-06 DIAGNOSIS — K219 Gastro-esophageal reflux disease without esophagitis: Secondary | ICD-10-CM

## 2019-03-06 DIAGNOSIS — I10 Essential (primary) hypertension: Secondary | ICD-10-CM | POA: Diagnosis not present

## 2019-03-06 DIAGNOSIS — E1169 Type 2 diabetes mellitus with other specified complication: Secondary | ICD-10-CM

## 2019-03-06 DIAGNOSIS — E785 Hyperlipidemia, unspecified: Secondary | ICD-10-CM

## 2019-03-06 DIAGNOSIS — B360 Pityriasis versicolor: Secondary | ICD-10-CM | POA: Diagnosis not present

## 2019-03-06 DIAGNOSIS — L219 Seborrheic dermatitis, unspecified: Secondary | ICD-10-CM

## 2019-03-06 LAB — POCT GLYCOSYLATED HEMOGLOBIN (HGB A1C): Hemoglobin A1C: 5.8 % — AB (ref 4.0–5.6)

## 2019-03-06 MED ORDER — DAPAGLIFLOZIN PRO-METFORMIN ER 10-1000 MG PO TB24: 1.0000 | ORAL_TABLET | Freq: Every day | ORAL | 0 refills | Status: DC

## 2019-03-06 MED ORDER — FLUCONAZOLE 150 MG PO TABS: ORAL_TABLET | ORAL | 0 refills | Status: DC

## 2019-03-06 MED ORDER — OMEPRAZOLE 40 MG PO CPDR: 40.0000 mg | DELAYED_RELEASE_CAPSULE | Freq: Every day | ORAL | 2 refills | Status: DC

## 2019-03-06 MED ORDER — SITAGLIPTIN PHOSPHATE 100 MG PO TABS: 100.0000 mg | ORAL_TABLET | Freq: Every day | ORAL | 1 refills | Status: DC

## 2019-03-06 MED ORDER — KETOCONAZOLE 2 % EX CREA: 1.0000 "application " | TOPICAL_CREAM | Freq: Two times a day (BID) | CUTANEOUS | 3 refills | Status: DC

## 2019-03-06 MED ORDER — LISINOPRIL 20 MG PO TABS: ORAL_TABLET | ORAL | 2 refills | Status: DC

## 2019-03-06 NOTE — Progress Notes (Signed)
Subjective:    Patient ID: Mark Stevens, male    DOB: [birth year], 53 y.o.   MRN: [id number]  HPI  Pt is a 53 yo male with T2DM, HTN, GERD who presents to the clinic for 3 month follow up.   He is not checking is sugars. He is taking his medication daily. He has stayed stable in weight loss. No open sores or wounds.   Pt does have some recurring under eye skin redness, drying and scales. At times area fills a little itchy. More under left than right. He also got his annual "white spots". He is requesting the pill we give him.   .. Active Ambulatory Problems    Diagnosis Date Noted  . Sleep disturbance 10/16/2013  . Snoring 10/16/2013  . Bipolar 1 disorder ([place]) 10/16/2013  . Obstructive sleep apnea 12/03/2013  . Essential hypertension 11/02/2014  . Hyperglycemia 01/05/2015  . Type 2 diabetes mellitus ([place]) 02/02/2015  . Poison ivy 05/05/2015  . Medial epicondylitis of elbow, left 10/09/2016  . 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 Nephrolithiasis 06/05/2017  . Tinea versicolor 10/16/2017  . Seborrheic keratoses 10/16/2017  . Class 1 obesity due to excess calories with serious comorbidity and body mass index (BMI) of 31.0 to 31.9 in adult 12/06/2017  . Hypertriglyceridemia 03/19/2018  . Gastroesophageal reflux disease without esophagitis 03/10/2019  . Hyperlipidemia LDL goal <70 03/10/2019  . Mixed hyperlipidemia 03/10/2019  . Seborrheic dermatitis 03/10/2019   Resolved Ambulatory Problems    Diagnosis Date Noted  . No Resolved Ambulatory Problems   Past Medical History:  Diagnosis Date  . Depression   . Hyperlipidemia   . Hypertension         Review of Systems See HPI.     Objective:   Physical Exam Vitals signs reviewed.  Constitutional:      Appearance: Normal appearance.  HENT:     Head: Normocephalic.  Cardiovascular:     Rate and Rhythm: Normal rate and regular rhythm.     Pulses: Normal pulses.  Pulmonary:     Effort: Pulmonary effort is normal.   Breath sounds: Normal breath sounds.  Skin:    Comments: Under lower left eye dry, red, irritated linear area with what appears to be fine scales on top. Worse left than right.   Trunk hypopigmented white circular areas.   Neurological:     General: No focal deficit present.     Mental Status: He is alert and oriented to person, place, and time.  Psychiatric:        Mood and Affect: Mood normal.           Assessment & Plan:  .[name] was seen today for diabetes.  Diagnoses and all orders for this visit:  Type 2 diabetes mellitus with other specified complication, without long-term current use of insulin ([place]) -     POCT HgB A1C -     COMPLETE METABOLIC PANEL WITH GFR  Type 2 diabetes mellitus without complication, without long-term current use of insulin (H[facility]) -     Dapagliflozin-metFORMIN HCl ER (XIGDUO XR) 07-999 MG TB24; Take 1 tablet by mouth daily. -     sitaGLIPtin (JANUVIA) 100 MG tablet; Take 1 tablet (100 mg total) by mouth daily.  Essential hypertension -     lisinopril (ZESTRIL) 20 MG tablet; One tablet by mouth daily for blood pressure control.  Tinea versicolor -     ketoconazole (NIZORAL) 2 % cream; Apply 1 application topically 2 (two) times daily.  To affected areas. -     fluconazole (DIFLUCAN) 150 MG tablet; Two tabs at once taken on a weekly basis for a total of three weeks.  Mixed hyperlipidemia  Hyperlipidemia LDL goal <70 -     Lipid Panel w/reflex Direct LDL  Gastroesophageal reflux disease without esophagitis -     omeprazole (PRILOSEC) 40 MG capsule; Take 1 capsule (40 mg total) by mouth daily.   .. Results for orders placed or performed in visit on 03/06/19  POCT HgB A1C  Result Value Ref Range   Hemoglobin A1C 5.8 (A) 4.0 - 5.6 %   HbA1c POC (<> result, manual entry)     HbA1c, POC (prediabetic range)     HbA1c, POC (controlled diabetic range)     A!C great.  Continue on same medications.  On STATIN.  On ACE. BP controlled.  Vaccines  up to date.  Labs ordered.   Sent diflucan for tinea versicolor.   Sent antifungal cream for under eye. Is suspect some seborrheic dermatitis.  Discussed using selsun blue as a body wash occasionally to keep fungus level low.   Resent cologuard. Discussed importance of getting colon cancer screening done.

## 2019-03-06 NOTE — Patient Instructions (Signed)
Seborrheic Dermatitis, Adult Seborrheic dermatitis is a skin disease that causes red, scaly patches. It usually occurs on the scalp, and it is often called dandruff. The patches may appear on other parts of the body. Skin patches tend to appear where there are many oil glands in the skin. Areas of the body that are commonly affected include:  Scalp.  Skin folds of the body.  Ears.  Eyebrows.  Neck.  Face.  Armpits.  The bearded area of men's faces. The condition may come and go for no known reason, and it is often long-lasting (chronic). What are the causes? The cause of this condition is not known. What increases the risk? This condition is more likely to develop in people who:  Have certain conditions, such as: ? HIV (human immunodeficiency virus). ? AIDS (acquired immunodeficiency syndrome). ? Parkinson disease. ? Mood disorders, such as depression.  Are 41-74 years old. What are the signs or symptoms? Symptoms of this condition include:  Thick scales on the scalp.  Redness on the face or in the armpits.  Skin that is flaky. The flakes may be white or yellow.  Skin that seems oily or dry but is not helped with moisturizers.  Itching or burning in the affected areas. How is this diagnosed? This condition is diagnosed with a medical history and physical exam. A sample of your skin may be tested (skin biopsy). You may need to see a skin specialist (dermatologist). How is this treated? There is no cure for this condition, but treatment can help to manage the symptoms. You may get treatment to remove scales, lower the risk of skin infection, and reduce swelling or itching. Treatment may include:  Creams that reduce swelling and irritation (steroids).  Creams that reduce skin yeast.  Medicated shampoo, soaps, moisturizing creams, or ointments.  Medicated moisturizing creams or ointments. Follow these instructions at home:  Apply over-the-counter and prescription  medicines only as told by your health care provider.  Use any medicated shampoo, soaps, skin creams, or ointments only as told by your health care provider.  Keep all follow-up visits as told by your health care provider. This is important. Contact a health care provider if:  Your symptoms do not improve with treatment.  Your symptoms get worse.  You have new symptoms. This information is not intended to replace advice given to you by your health care provider. Make sure you discuss any questions you have with your health care provider. Document Released: 09/17/2005 Document Revised: 04/06/2016 Document Reviewed: 01/05/2016 Elsevier Interactive Patient Education  2019 [place] versicolor is a skin infection. It is caused by a type of yeast. It is normal for some yeast to be on your skin, but too much yeast causes this infection. The infection causes a rash of light or dark patches on your skin. The rash is most common on the chest, back, neck, or upper arms. The infection usually does not cause other problems. If it is treated, it will probably go away in a few weeks. The infection cannot be spread from one person to another (is not contagious). Follow these instructions at home:  Use over-the-counter and prescription medicines only as told by your doctor.  Scrub your skin every day with dandruff shampoo as told by your doctor.  Do not scratch your skin in the rash area.  Avoid places that are hot and humid.  Do not use tanning booths.  Try to avoid sweating a lot. Contact a doctor  if:  Your symptoms get worse.  You have a fever.  You have redness, swelling, or pain in the rash area.  You have fluid or blood coming from your rash.  Your rash feels warm to the touch.  You have pus or a bad smell coming from your rash.  Your rash comes back (recurs) after treatment. Summary  Tinea versicolor is a skin infection. It causes a rash of light or  dark patches on your skin.  The rash is most common on the chest, back, neck, or upper arms. This infection usually does not cause other problems.  Use over-the-counter and prescription medicines only as told by your doctor.  If the infection is treated, it will probably go away in a few weeks. This information is not intended to replace advice given to you by your health care provider. Make sure you discuss any questions you have with your health care provider. Document Released: 08/30/2008 Document Revised: 05/20/2017 Document Reviewed: 05/20/2017 Elsevier Interactive Patient Education  2019 [employer].

## 2019-03-10 ENCOUNTER — Encounter: Payer: Self-pay | Admitting: Physician Assistant

## 2019-03-10 DIAGNOSIS — E785 Hyperlipidemia, unspecified: Secondary | ICD-10-CM | POA: Insufficient documentation

## 2019-03-10 DIAGNOSIS — L219 Seborrheic dermatitis, unspecified: Secondary | ICD-10-CM | POA: Insufficient documentation

## 2019-03-10 DIAGNOSIS — E782 Mixed hyperlipidemia: Secondary | ICD-10-CM | POA: Insufficient documentation

## 2019-03-10 DIAGNOSIS — K219 Gastro-esophageal reflux disease without esophagitis: Secondary | ICD-10-CM | POA: Insufficient documentation

## 2019-03-13 ENCOUNTER — Ambulatory Visit: Payer: 59 | Admitting: Physician Assistant

## 2019-03-14 LAB — COMPLETE METABOLIC PANEL WITH GFR
AG Ratio: 2 (calc) (ref 1.0–2.5)
ALT: 16 U/L (ref 9–46)
AST: 13 U/L (ref 10–35)
Albumin: 4.5 g/dL (ref 3.6–5.1)
Alkaline phosphatase (APISO): 53 U/L (ref 35–144)
BUN: 11 mg/dL (ref 7–25)
CO2: 24 mmol/L (ref 20–32)
Calcium: 9.5 mg/dL (ref 8.6–10.3)
Chloride: 103 mmol/L (ref 98–110)
Creat: 1.3 mg/dL (ref 0.70–1.33)
GFR, Est African American: 72 mL/min/{1.73_m2} (ref >=60)
GFR, Est Non African American: 62 mL/min/{1.73_m2} (ref >=60)
Globulin: 2.3 g/dL (calc) (ref 1.9–3.7)
Glucose, Bld: 108 mg/dL — ABNORMAL HIGH (ref 65–99)
Potassium: 4.4 mmol/L (ref 3.5–5.3)
Sodium: 139 mmol/L (ref 135–146)
Total Bilirubin: 0.7 mg/dL (ref 0.2–1.2)
Total Protein: 6.8 g/dL (ref 6.1–8.1)

## 2019-03-14 LAB — LIPID PANEL W/REFLEX DIRECT LDL
Cholesterol: 161 mg/dL (ref <200)
HDL: 53 mg/dL (ref >=40)
LDL Cholesterol (Calc): 83 mg/dL (calc)
Non-HDL Cholesterol (Calc): 108 mg/dL (calc) (ref <130)
Total CHOL/HDL Ratio: 3 (calc) (ref <5.0)
Triglycerides: 157 mg/dL — ABNORMAL HIGH (ref <150)

## 2019-03-16 ENCOUNTER — Telehealth: Payer: Self-pay | Admitting: Neurology

## 2019-03-16 NOTE — Progress Notes (Signed)
Call pt: TG much better. HDL better. LDL went up a little. Kidney, liver look great!

## 2019-03-16 NOTE — Telephone Encounter (Signed)
Cologuard order faxed to [phone] with confirmation received. They will contact the patient directly.   

## 2019-04-16 LAB — COLOGUARD: Cologuard: NEGATIVE

## 2019-04-24 ENCOUNTER — Encounter: Payer: Self-pay | Admitting: Physician Assistant

## 2019-05-01 ENCOUNTER — Telehealth: Payer: Self-pay | Admitting: Neurology

## 2019-05-01 MED ORDER — PROMETHAZINE-DM 6.25-15 MG/5ML PO SOLN: 5.0000 mL | Freq: Four times a day (QID) | ORAL | 0 refills | Status: DC | PRN

## 2019-05-01 NOTE — Telephone Encounter (Signed)
Patient states he was prescribed something at last visit for congestion/phlegm but now having issues again. Requesting medication. I don't see anything about this in last office note. Please advise.

## 2019-05-01 NOTE — Telephone Encounter (Signed)
I looked back and looked like given promethazineDM sent to pharmacy.

## 2019-05-01 NOTE — Telephone Encounter (Signed)
Patient made aware RX sent.

## 2019-05-05 ENCOUNTER — Telehealth: Payer: Self-pay | Admitting: Neurology

## 2019-05-05 NOTE — Telephone Encounter (Signed)
I have not. We can call them and see what is up though?

## 2019-05-05 NOTE — Telephone Encounter (Signed)
Patient received letter stating Cologuard test results sent to [place] and he is looking for results. Have you received these?

## 2019-05-07 NOTE — Telephone Encounter (Signed)
Called and received result. Patient made aware negative.

## 2019-05-29 ENCOUNTER — Other Ambulatory Visit: Payer: Self-pay | Admitting: Physician Assistant

## 2019-05-29 DIAGNOSIS — K219 Gastro-esophageal reflux disease without esophagitis: Secondary | ICD-10-CM

## 2019-06-25 ENCOUNTER — Ambulatory Visit: Payer: 59 | Admitting: Physician Assistant

## 2019-06-26 ENCOUNTER — Ambulatory Visit: Payer: 59 | Admitting: Physician Assistant

## 2019-06-29 ENCOUNTER — Encounter: Payer: Self-pay | Admitting: Physician Assistant

## 2019-06-29 ENCOUNTER — Ambulatory Visit: Payer: 59 | Admitting: Physician Assistant

## 2019-06-29 ENCOUNTER — Other Ambulatory Visit: Payer: Self-pay

## 2019-06-29 VITALS — BP 124/67 | HR 76 | Ht 70.0 in | Wt 218.0 lb

## 2019-06-29 DIAGNOSIS — J3489 Other specified disorders of nose and nasal sinuses: Secondary | ICD-10-CM | POA: Insufficient documentation

## 2019-06-29 DIAGNOSIS — R05 Cough: Secondary | ICD-10-CM

## 2019-06-29 DIAGNOSIS — F319 Bipolar disorder, unspecified: Secondary | ICD-10-CM

## 2019-06-29 DIAGNOSIS — E1169 Type 2 diabetes mellitus with other specified complication: Secondary | ICD-10-CM

## 2019-06-29 DIAGNOSIS — R059 Cough, unspecified: Secondary | ICD-10-CM | POA: Insufficient documentation

## 2019-06-29 DIAGNOSIS — J302 Other seasonal allergic rhinitis: Secondary | ICD-10-CM | POA: Diagnosis not present

## 2019-06-29 LAB — POCT GLYCOSYLATED HEMOGLOBIN (HGB A1C): Hemoglobin A1C: 5.8 % — AB (ref 4.0–5.6)

## 2019-06-29 MED ORDER — CHLORPHENIRAMINE MALEATE 4 MG PO TABS: 4.0000 mg | ORAL_TABLET | Freq: Two times a day (BID) | ORAL | 0 refills | Status: DC | PRN

## 2019-06-29 MED ORDER — BUPROPION HCL ER (XL) 300 MG PO TB24: 300.0000 mg | ORAL_TABLET | Freq: Every day | ORAL | 1 refills | Status: DC

## 2019-06-29 MED ORDER — FLUTICASONE PROPIONATE 50 MCG/ACT NA SUSP: 2.0000 | Freq: Every day | NASAL | 2 refills | Status: DC

## 2019-06-29 MED ORDER — SITAGLIPTIN PHOSPHATE 100 MG PO TABS: 100.0000 mg | ORAL_TABLET | Freq: Every day | ORAL | 1 refills | Status: DC

## 2019-06-29 MED ORDER — XIGDUO XR 10-1000 MG PO TB24: 1.0000 | ORAL_TABLET | Freq: Every day | ORAL | 0 refills | Status: DC

## 2019-06-29 NOTE — Patient Instructions (Signed)
Zyrtec 10 mg at bedtime

## 2019-06-29 NOTE — Progress Notes (Signed)
Subjective:    Patient ID: Mark Stevens, male    DOB: 1966-07-29, 53 y.o.   MRN: [id number]  HPI  Patient is a 53 year old male with type 2 diabetes, GERD, hypertension, bipolar 1 disorder who presents to the clinic for 100-month follow-up.  Overall he feels like his diabetes is doing well.  He is not checking his sugars regularly.  He is trying to watch his sugars and carbs in his diet.  He is staying relatively active but denies any exercise.  He denies any open sores or wounds.  He is taking his medication daily.  He does complain of some persistent sinus drainage and throat clearing for the last few months.  He is not taking any allergy medications.  He denies any significant sinus pressure, ear pain, fever, chills, shortness of breath, fatigue.  He does notice his eyes watering from time to time.  .. Active Ambulatory Problems    Diagnosis Date Noted  . Sleep disturbance 10/16/2013  . Snoring 10/16/2013  . Bipolar 1 disorder ([name]) 10/16/2013  . Obstructive sleep apnea 12/03/2013  . Essential hypertension 11/02/2014  . Hyperglycemia 01/05/2015  . Type 2 diabetes mellitus ([name]) 02/02/2015  . Medial epicondylitis of elbow, left 10/09/2016  . 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 Nephrolithiasis 06/05/2017  . Tinea versicolor 10/16/2017  . Seborrheic keratoses 10/16/2017  . Class 1 obesity due to excess calories with serious comorbidity and body mass index (BMI) of 31.0 to 31.9 in adult 12/06/2017  . Hypertriglyceridemia 03/19/2018  . Gastroesophageal reflux disease without esophagitis 03/10/2019  . Hyperlipidemia LDL goal <70 03/10/2019  . Mixed hyperlipidemia 03/10/2019  . Seborrheic dermatitis 03/10/2019  . Seasonal allergies 06/29/2019  . Sinus drainage 06/29/2019  . Cough 06/29/2019   Resolved Ambulatory Problems    Diagnosis Date Noted  . Poison ivy 05/05/2015   Past Medical History:  Diagnosis Date  . Depression   . Hyperlipidemia   . Hypertension       Review of  Systems  All other systems reviewed and are negative.      Objective:   Physical Exam Vitals signs reviewed.  Constitutional:      Appearance: Normal appearance. He is obese.  HENT:     Head: Normocephalic.     Right Ear: Tympanic membrane and external ear normal.     Left Ear: Tympanic membrane and external ear normal.     Nose: Rhinorrhea present.     Mouth/Throat:     Pharynx: Posterior oropharyngeal erythema present.     Comments: Clear drainage in oropharynx.  Eyes:     Extraocular Movements: Extraocular movements intact.     Conjunctiva/sclera: Conjunctivae normal.     Pupils: Pupils are equal, round, and reactive to light.     Comments: Bilateral watery eyes.   Neck:     Musculoskeletal: Normal range of motion and neck supple.  Cardiovascular:     Rate and Rhythm: Normal rate and regular rhythm.     Pulses: Normal pulses.  Pulmonary:     Effort: Pulmonary effort is normal.     Breath sounds: Normal breath sounds.  Lymphadenopathy:     Cervical: No cervical adenopathy.  Neurological:     General: No focal deficit present.     Mental Status: He is alert and oriented to person, place, and time.  Psychiatric:        Mood and Affect: Mood normal.        Behavior: Behavior normal.     .[name]  Depression screen [facility] 2/9 03/06/2019 06/13/2018 12/06/2017 06/05/2017  Decreased Interest 0 0 0 0  Down, Depressed, Hopeless 0 1 0 0  PHQ - 2 Score 0 1 0 0  Altered sleeping 0 0 1 -  Tired, decreased energy 0 0 1 -  Change in appetite 0 1 0 -  Feeling bad or failure about yourself  0 1 1 -  Trouble concentrating 0 0 0 -  Moving slowly or fidgety/restless 0 0 0 -  Suicidal thoughts 0 0 0 -  PHQ-9 Score 0 3 3 -  Difficult doing work/chores Not difficult at all Not difficult at all Somewhat difficult -   .. Results for orders placed or performed in visit on 06/29/19  POCT glycosylated hemoglobin (Hb A1C)  Result Value Ref Range   Hemoglobin A1C 5.8 (A) 4.0 - 5.6 %   HbA1c POC (<>  result, manual entry)     HbA1c, POC (prediabetic range)     HbA1c, POC (controlled diabetic range)           Assessment & Plan:  .[name] was seen today for diabetes.  Diagnoses and all orders for this visit:  Type 2 diabetes mellitus with other specified complication, without long-term current use of insulin (HCC) -     POCT glycosylated hemoglobin (Hb A1C) -     sitaGLIPtin (JANUVIA) 100 MG tablet; Take 1 tablet (100 mg total) by mouth daily. -     Dapagliflozin-metFORMIN HCl ER (XIGDUO XR) 07-999 MG TB24; Take 1 tablet by mouth daily.  Seasonal allergies -     chlorpheniramine (CHLOR-TRIMETON) 4 MG tablet; Take 1 tablet (4 mg total) by mouth 2 (two) times daily as needed for allergies. -     fluticasone (FLONASE) 50 MCG/ACT nasal spray; Place 2 sprays into both nostrils daily.  Sinus drainage -     chlorpheniramine (CHLOR-TRIMETON) 4 MG tablet; Take 1 tablet (4 mg total) by mouth 2 (two) times daily as needed for allergies. -     fluticasone (FLONASE) 50 MCG/ACT nasal spray; Place 2 sprays into both nostrils daily.  Cough -     chlorpheniramine (CHLOR-TRIMETON) 4 MG tablet; Take 1 tablet (4 mg total) by mouth 2 (two) times daily as needed for allergies. -     fluticasone (FLONASE) 50 MCG/ACT nasal spray; Place 2 sprays into both nostrils daily.  Bipolar 1 disorder (HCC) -     buPROPion (WELLBUTRIN XL) 300 MG 24 hr tablet; Take 1 tablet (300 mg total) by mouth daily.   A1C has improved some.  Stay on same medications.  On ACE. BP to goal.  On STATIN. LDL goal under 70.  Encouraged yearly eye exam.  Pt declined flu/pneumonia vaccine.  Foot exam UTD.  Not smoker.  Not on ASA.   Symptoms sound like allergies. Start 7 days of chlor-trimeton then switch to zyrtec 10mg  at bedtime. Use flonase daily at first then as needed. Explained how to use nasal spray.   Medications refilled.   Follow up in 3 months.

## 2019-06-30 ENCOUNTER — Telehealth: Payer: Self-pay | Admitting: Neurology

## 2019-06-30 DIAGNOSIS — J3489 Other specified disorders of nose and nasal sinuses: Secondary | ICD-10-CM

## 2019-06-30 DIAGNOSIS — J302 Other seasonal allergic rhinitis: Secondary | ICD-10-CM

## 2019-06-30 DIAGNOSIS — R059 Cough, unspecified: Secondary | ICD-10-CM

## 2019-06-30 DIAGNOSIS — R05 Cough: Secondary | ICD-10-CM

## 2019-06-30 MED ORDER — CHLORPHENIRAMINE MALEATE 4 MG PO TABS: 4.0000 mg | ORAL_TABLET | Freq: Two times a day (BID) | ORAL | 1 refills | Status: DC | PRN

## 2019-06-30 NOTE — Telephone Encounter (Signed)
I can't make them fill OTC prescription. We give flonase prescription and OTC so I thought they could do that.   I think it is pretty affordable like 14 dollars. You cannot tolerate prednisone or I would have sent that.

## 2019-06-30 NOTE — Telephone Encounter (Signed)
Spoke with [facility] and attempted to use GoodRx coupon for Rx. They were able to get the OOP cost down to $10, but still did not match what the coupon should be.  Sent Rx to [facility] in [place]. They can order from a wholesaler for $1.13. Will be delivered tomorrow.   Pt advised. He will go get single Rx from [facility].

## 2019-06-30 NOTE — Telephone Encounter (Signed)
Patient walked into the office this morning stating pharmacy would not fill his chlorpheniramine. I called pharmacy to check on this and they state that his insurance would not cover this because it is an over the counter medication. He states [name] sent this and said it would be cheaper as a prescription. I made him aware that is sometimes the case, but if his insurance won't cover it, he should purchase OTC medication. He states he talked to the pharmacist directly yesterday and he told him it would not be filled because they could not break open an over the counter medication to fill a partial prescription. He then demanded to speak with Luvenia Starch or Lacretia Nicks about this. I made him aware I would pass along the message.

## 2019-07-15 ENCOUNTER — Telehealth: Payer: Self-pay | Admitting: Physician Assistant

## 2019-07-15 MED ORDER — AMOXICILLIN-POT CLAVULANATE 875-125 MG PO TABS: 1.0000 | ORAL_TABLET | Freq: Two times a day (BID) | ORAL | 0 refills | Status: DC

## 2019-07-15 NOTE — Telephone Encounter (Signed)
Left VM for Pt with recommendation  

## 2019-07-15 NOTE — Telephone Encounter (Signed)
Spoke with Pt. He advises he feels like there is phlegm in his throat that he is unable to clear. Does question if it is due to sinus drainage. Tried and failed OTC Rx's, including the new chlortabs.  Denies acid reflux. States he was tried on acid reflux Rx in the past and it didn't help.   Routing back to PCP for review

## 2019-07-15 NOTE — Telephone Encounter (Addendum)
Pt called. He's still having problems with his throat and wants to know if he needs to be referred to ENT.  Thanks

## 2019-07-15 NOTE — Telephone Encounter (Signed)
Current symptoms sinus drainage and cough? Or just cough? Did zyrtec help at all? Is it worse morning or at night? Did you try flonase?  Acid reflux can surprising cause cough as well. We could try acid reducer to see if it helps at all.   Usually for persistent cough we send to pulmonology.

## 2019-07-15 NOTE — Telephone Encounter (Signed)
Ok lets try augmentin for sinusitis more chronic nature and see if any improvement. Update in 10 days.

## 2019-07-31 ENCOUNTER — Telehealth: Payer: Self-pay | Admitting: Physician Assistant

## 2019-07-31 DIAGNOSIS — R05 Cough: Secondary | ICD-10-CM

## 2019-07-31 DIAGNOSIS — J3489 Other specified disorders of nose and nasal sinuses: Secondary | ICD-10-CM

## 2019-07-31 DIAGNOSIS — J320 Chronic maxillary sinusitis: Secondary | ICD-10-CM

## 2019-07-31 DIAGNOSIS — R059 Cough, unspecified: Secondary | ICD-10-CM

## 2019-07-31 NOTE — Telephone Encounter (Signed)
Referral placed to Dr. Radene Journey. Should be called in next 3 business days.

## 2019-07-31 NOTE — Telephone Encounter (Signed)
Patient called in, stating that his medication isn't working and was questioning the ENT referral, if he could be referred and when he would hear something. Please Advise.

## 2019-07-31 NOTE — Telephone Encounter (Signed)
Called patient to let him know the referral has been placed.

## 2019-08-06 ENCOUNTER — Encounter (INDEPENDENT_AMBULATORY_CARE_PROVIDER_SITE_OTHER): Payer: Self-pay | Admitting: Otolaryngology

## 2019-08-06 ENCOUNTER — Other Ambulatory Visit: Payer: Self-pay

## 2019-08-06 ENCOUNTER — Ambulatory Visit (INDEPENDENT_AMBULATORY_CARE_PROVIDER_SITE_OTHER): Payer: 59 | Admitting: Otolaryngology

## 2019-08-06 VITALS — Temp 97.6°F

## 2019-08-06 DIAGNOSIS — J310 Chronic rhinitis: Secondary | ICD-10-CM | POA: Diagnosis not present

## 2019-08-06 DIAGNOSIS — K219 Gastro-esophageal reflux disease without esophagitis: Secondary | ICD-10-CM | POA: Diagnosis not present

## 2019-08-06 NOTE — Progress Notes (Signed)
HPI: Mark Stevens is a 53 y.o. male who presents is referred by his PCP for evaluation of chronic postnasal drainage that he has had since last spring.  He describes the mucus is very thick and gets caught down in his throat where he has trouble clearing it.  He feels like he has a globus type symptoms where mucus gets stuck and points to the region of the larynx and cricoid cartilage.  He really does not blow much mucus out of his nose.  He has been treated previously with antihistamines as well as Mucinex with minimal benefit.  He always seems to have problems in the spring but it generally clears up in the summer but this time it has been persistent throughout the summer and into the fall.  He denies any trouble breathing through his nose.  No paranasal pain or discomfort.  He has been recently treated with a course of Augmentin without any benefit. He does not smoke.  Past Medical History:  Diagnosis Date  . Bipolar 1 disorder ([place])   . Depression   . Hyperlipidemia   . Hypertension    No past surgical history on file. Social History   Socioeconomic History  . Marital status: Married    Spouse name: Not on file  . Number of children: Not on file  . Years of education: Not on file  . Highest education level: Not on file  Occupational History  . Not on file  Social Needs  . Financial resource strain: Not on file  . Food insecurity    Worry: Not on file    Inability: Not on file  . Transportation needs    Medical: Not on file    Non-medical: Not on file  Tobacco Use  . Smoking status: Never Smoker  . Smokeless tobacco: Never Used  Substance and Sexual Activity  . Alcohol use: No  . Drug use: No  . Sexual activity: Not Currently    Partners: Female  Lifestyle  . Physical activity    Days per week: Not on file    Minutes per session: Not on file  . Stress: Not on file  Relationships  . Social Herbalist on phone: Not on file    Gets together: Not on file     Attends religious service: Not on file    Active member of club or organization: Not on file    Attends meetings of clubs or organizations: Not on file    Relationship status: Not on file  Other Topics Concern  . Not on file  Social History Narrative  . Not on file   Family History  Problem Relation Age of Onset  . Prostate cancer Unknown        uncle  . Atrial fibrillation Father   . Congestive Heart Failure Unknown    Allergies  Allergen Reactions  . Epinephrine   . Prednisone     Vision loss   Prior to Admission medications   Medication Sig Start Date End Date Taking? Authorizing Provider  amoxicillin-clavulanate (AUGMENTIN) 875-125 MG tablet Take 1 tablet by mouth 2 (two) times daily. 07/15/19   Breeback, [name] L, PA-C  atorvastatin (LIPITOR) 10 MG tablet Take 1 tablet (10 mg total) by mouth daily. 09/05/18   Breeback, [name] L, PA-C  buPROPion (WELLBUTRIN XL) 300 MG 24 hr tablet Take 1 tablet (300 mg total) by mouth daily. 06/29/19   Breeback, [name] L, PA-C  chlorpheniramine (CHLOR-TRIMETON) 4 MG tablet  Take 1 tablet (4 mg total) by mouth 2 (two) times daily as needed for allergies. 06/30/19   Breeback, [name] L, PA-C  Dapagliflozin-metFORMIN HCl ER (XIGDUO XR) 07-999 MG TB24 Take 1 tablet by mouth daily. 06/29/19   Breeback, [name] L, PA-C  divalproex (DEPAKOTE ER) 500 MG 24 hr tablet Take by mouth 2 (two) times daily.    [provider]  fluconazole (DIFLUCAN) 150 MG tablet Two tabs at once taken on a weekly basis for a total of three weeks. 03/06/19   Breeback, [name] L, PA-C  fluticasone (FLONASE) 50 MCG/ACT nasal spray Place 2 sprays into both nostrils daily. 06/29/19   Breeback, [name] L, PA-C  lisinopril (ZESTRIL) 20 MG tablet One tablet by mouth daily for blood pressure control. 03/06/19 03/05/20  Breeback, Luvenia Starch L, PA-C  Promethazine-DM 6.25-15 MG/5ML SOLN Take 5 mLs by mouth every 6 (six) hours as needed. 05/01/19   Breeback, [name] L, PA-C  sitaGLIPtin (JANUVIA) 100 MG tablet Take 1  tablet (100 mg total) by mouth daily. 06/29/19   Breeback, [name] L, PA-C     Positive ROS: He does have some anxiety issues but is otherwise healthy.  All other systems have been reviewed and were otherwise negative with the exception of those mentioned in the HPI and as above.  Physical Exam: General: Alert, no acute distress Ears: Ear canals are clear bilaterally with intact, clear TMs Nasal: He has moderate rhinitis with a moderate mucosal edema.  On nasal endoscopy both middle meatus regions are clear with no significant drainage and no evidence of active infection or mucopurulent discharge. Oral: Clear oropharynx.  Posterior oral cavity is clear with no obvious postnasal drainage noted in the posterior oropharynx. Fiberoptic laryngoscopy was performed in the office.  Nasal cavity was mostly clear with mild to moderate rhinitis but no signs of infection.  Nasopharynx was clear.  Base of tongue vallecula and epiglottis were normal.  Vocal cords were clear bilaterally.  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 Neck: No palpable adenopathy or masses  Laryngoscopy  Date/Time: 08/06/2019 4:52 PM Performed by: Rozetta Nunnery, MD Authorized by: Rozetta Nunnery, MD   Consent:    Consent obtained:  Verbal   Consent given by:  Patient Procedure details:    Indications: assessment of airway and difficult indirect mirror examination     Medication:  Afrin   Instrument: flexible fiberoptic laryngoscope     Scope location: right nare   Nasal cavity:    Right inferior turbinates: normal     Left inferior turbinates: normal   Septum:    normal   Mouth:    Oropharynx: normal     Vallecula: normal     Base of tongue: normal     Epiglottis: normal   Throat:     comment:  Patient has swelling and inflammation of both arytenoid mucosa   Pyriform sinus: normal     True vocal cords: normal    Comments:     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 Also prescribed omeprazole 40 mg daily before dinner for the next 2 months.   Radene Journey, MD   CC:

## 2019-08-07 ENCOUNTER — Other Ambulatory Visit: Payer: Self-pay | Admitting: Physician Assistant

## 2019-08-07 ENCOUNTER — Telehealth: Payer: Self-pay | Admitting: Physician Assistant

## 2019-08-07 DIAGNOSIS — I10 Essential (primary) hypertension: Secondary | ICD-10-CM

## 2019-08-07 NOTE — Telephone Encounter (Signed)
Patient called, stating that he got a text from his pharmacy that said a prescription was not authorized and he needed to call his PCP. I asked which medication and he said that he wasn't sure, he said text said "LI" and that he didn't think he needed a refill as of now. He has a follow up appointment for the middle of December with PCP. Patient wanted a call back regarding this. Please Advise.

## 2019-08-10 MED ORDER — LISINOPRIL 20 MG PO TABS: ORAL_TABLET | ORAL | 0 refills | Status: DC

## 2019-08-10 NOTE — Telephone Encounter (Signed)
Called patient back. States it was about his Lisinopril. Patient is not sure if refill is needed. Will send in RX to hold pt over until appt on 09/21/19

## 2019-09-14 ENCOUNTER — Telehealth (INDEPENDENT_AMBULATORY_CARE_PROVIDER_SITE_OTHER): Payer: Self-pay

## 2019-09-21 ENCOUNTER — Ambulatory Visit (INDEPENDENT_AMBULATORY_CARE_PROVIDER_SITE_OTHER): Payer: 59 | Admitting: Physician Assistant

## 2019-09-21 ENCOUNTER — Encounter: Payer: Self-pay | Admitting: Physician Assistant

## 2019-09-21 VITALS — Temp 96.1°F | Ht 70.0 in | Wt 218.0 lb

## 2019-09-21 DIAGNOSIS — I10 Essential (primary) hypertension: Secondary | ICD-10-CM

## 2019-09-21 DIAGNOSIS — E1169 Type 2 diabetes mellitus with other specified complication: Secondary | ICD-10-CM

## 2019-09-21 DIAGNOSIS — E785 Hyperlipidemia, unspecified: Secondary | ICD-10-CM

## 2019-09-21 MED ORDER — SITAGLIPTIN PHOSPHATE 100 MG PO TABS: 100.0000 mg | ORAL_TABLET | Freq: Every day | ORAL | 1 refills | Status: DC

## 2019-09-21 MED ORDER — ATORVASTATIN CALCIUM 10 MG PO TABS: 10.0000 mg | ORAL_TABLET | Freq: Every day | ORAL | 1 refills | Status: DC

## 2019-09-21 MED ORDER — LISINOPRIL 20 MG PO TABS: ORAL_TABLET | ORAL | 1 refills | Status: DC

## 2019-09-21 MED ORDER — XIGDUO XR 10-1000 MG PO TB24: 1.0000 | ORAL_TABLET | Freq: Every day | ORAL | 1 refills | Status: DC

## 2019-09-21 NOTE — Progress Notes (Signed)
Patient ID: Mark Stevens, male   DOB: 10-Oct-1965, 53 y.o.   MRN: [id number] .Marland KitchenVirtual Visit via Video Note  I connected with Mark Stevens on 09/21/19 at 10:10 AM EST by a video enabled telemedicine application and verified that I am speaking with the correct person using two identifiers.  Location: Patient: home Provider: home   I discussed the limitations of evaluation and management by telemedicine and the availability of in person appointments. The patient expressed understanding and agreed to proceed.  History of Present Illness: Pt is a 53 yo male with T2DM, HTN, HLD who calls into the clinic for medication refills.   Pt does not check sugars. He denies any problems or complaints. He is not doing great with diet or exercise. No open wounds or sores. No hypoglycemia. Taking medication daily. Last a1C was    .[name] Lab Results  Component Value Date   HGBA1C 5.8 (A) 06/29/2019   .[name] Active Ambulatory Problems    Diagnosis Date Noted  . Sleep disturbance 10/16/2013  . Snoring 10/16/2013  . Bipolar 1 disorder ([place]) 10/16/2013  . Obstructive sleep apnea 12/03/2013  . Essential hypertension 11/02/2014  . Hyperglycemia 01/05/2015  . Type 2 diabetes mellitus ([place]) 02/02/2015  . Medial epicondylitis of elbow, left 10/09/2016  . 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 Nephrolithiasis 06/05/2017  . Tinea versicolor 10/16/2017  . Seborrheic keratoses 10/16/2017  . Class 1 obesity due to excess calories with serious comorbidity and body mass index (BMI) of 31.0 to 31.9 in adult 12/06/2017  . Hypertriglyceridemia 03/19/2018  . Gastroesophageal reflux disease without esophagitis 03/10/2019  . Hyperlipidemia LDL goal <70 03/10/2019  . Mixed hyperlipidemia 03/10/2019  . Seborrheic dermatitis 03/10/2019  . Seasonal allergies 06/29/2019  . Sinus drainage 06/29/2019  . Cough 06/29/2019   Resolved Ambulatory Problems    Diagnosis Date Noted  . Poison ivy 05/05/2015   Past Medical History:   Diagnosis Date  . Depression   . Hyperlipidemia   . Hypertension    Reviewed med, allergy, problem list.   Observations/Objective: No acute distress. Normal appearance and mood.   .. Today's Vitals   09/21/19 0913  Temp: (!) 96.1 F (35.6 C)  TempSrc: Oral  Weight: 218 lb (98.9 kg)  Height: 5\' 10"  (1.778 m)   Body mass index is 31.28 kg/m.    Assessment and Plan: .[name] was seen today for diabetes.  Diagnoses and all orders for this visit:  Type 2 diabetes mellitus with other specified complication, without long-term current use of insulin (HCC) -     Dapagliflozin-metFORMIN HCl ER (XIGDUO XR) 07-999 MG TB24; Take 1 tablet by mouth daily. -     sitaGLIPtin (JANUVIA) 100 MG tablet; Take 1 tablet (100 mg total) by mouth daily.  Essential hypertension -     lisinopril (ZESTRIL) 20 MG tablet; One tablet by mouth daily for blood pressure control.  Hyperlipidemia LDL goal <70 -     atorvastatin (LIPITOR) 10 MG tablet; Take 1 tablet (10 mg total) by mouth daily.   A1C not done today. 3 months ago was 5.8.  Other labs UTD. Will check at next visit.  Continue same medications.  Continue DM diet and regular exercise.  On STATIN.  On ACE.  Needs eye exam.  Flu and pneumonia vaccine UTD.  Follow up in 3 months.     Follow Up Instructions:    I discussed the assessment and treatment plan with the patient. The patient was provided an opportunity to ask questions  and all were answered. The patient agreed with the plan and demonstrated an understanding of the instructions.   The patient was advised to call back or seek an in-person evaluation if the symptoms worsen or if the condition fails to improve as anticipated.    [name], PA-C

## 2019-09-21 NOTE — Progress Notes (Signed)
Patient doing well. Following up for med refills. PHQ9-GAD7 completed.  Does not check blood sugars at home.

## 2019-09-22 ENCOUNTER — Encounter: Payer: Self-pay | Admitting: Physician Assistant

## 2019-09-28 ENCOUNTER — Ambulatory Visit: Payer: 59 | Admitting: Physician Assistant

## 2019-10-12 ENCOUNTER — Ambulatory Visit (INDEPENDENT_AMBULATORY_CARE_PROVIDER_SITE_OTHER): Payer: 59 | Admitting: Physician Assistant

## 2019-10-12 ENCOUNTER — Encounter: Payer: Self-pay | Admitting: Physician Assistant

## 2019-10-12 VITALS — Temp 97.7°F | Ht 70.0 in | Wt 218.0 lb

## 2019-10-12 DIAGNOSIS — J310 Chronic rhinitis: Secondary | ICD-10-CM

## 2019-10-12 DIAGNOSIS — J0101 Acute recurrent maxillary sinusitis: Secondary | ICD-10-CM

## 2019-10-12 MED ORDER — AZITHROMYCIN 250 MG PO TABS: ORAL_TABLET | ORAL | 0 refills | Status: DC

## 2019-10-12 NOTE — Progress Notes (Signed)
Has been going on for a few weeks:  Drainage for month  Symptoms on/off for a couple weeks/symptoms really bad last night: Sinus pressure Headache - forehead/face Right ear pain > left ear  Last night took tylenol/advil Has been taking nasal/allergy spray ([facility] brand) - not helping Was taking mucinex - stopped/not helping

## 2019-10-12 NOTE — Progress Notes (Signed)
Patient ID: Mark Stevens, male   DOB: 02-18-1966, 54 y.o.   MRN: [id number] .Marland KitchenVirtual Visit via Video Note  I connected with Mark Stevens on 10/12/19 at 11:10 AM EST by a video enabled telemedicine application and verified that I am speaking with the correct person using two identifiers.  Location: Patient: home Provider: clinic   I discussed the limitations of evaluation and management by telemedicine and the availability of in person appointments. The patient expressed understanding and agreed to proceed.  History of Present Illness: Pt is a 54 yo male with chronic rhinitis and recurrent sinusitis who presents to the clinic with ongoing sinus drainage and now worsening sinus pressure, headache, right ear pain. He thinks he has sinus infection now. Worsening symptoms for last 2 weeks. He has seen ENT for chronic rhinitis and on nasacort daily with saline irritations. He is taking mucinex but not helping. Taking tylenol and advil. No SOB, loss of smell or taste, GI issues, body aches, chills, fever. No sick contacts.  .. Active Ambulatory Problems    Diagnosis Date Noted  . Sleep disturbance 10/16/2013  . Snoring 10/16/2013  . Bipolar 1 disorder ([place]) 10/16/2013  . Obstructive sleep apnea 12/03/2013  . Essential hypertension 11/02/2014  . Hyperglycemia 01/05/2015  . Type 2 diabetes mellitus ([place]) 02/02/2015  . Medial epicondylitis of elbow, left 10/09/2016  . 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 Nephrolithiasis 06/05/2017  . Tinea versicolor 10/16/2017  . Seborrheic keratoses 10/16/2017  . Class 1 obesity due to excess calories with serious comorbidity and body mass index (BMI) of 31.0 to 31.9 in adult 12/06/2017  . Hypertriglyceridemia 03/19/2018  . Gastroesophageal reflux disease without esophagitis 03/10/2019  . Hyperlipidemia LDL goal <70 03/10/2019  . Mixed hyperlipidemia 03/10/2019  . Seborrheic dermatitis 03/10/2019  . Seasonal allergies 06/29/2019  . Sinus drainage 06/29/2019   . Cough 06/29/2019   Resolved Ambulatory Problems    Diagnosis Date Noted  . Poison ivy 05/05/2015   Past Medical History:  Diagnosis Date  . Depression   . Hyperlipidemia   . Hypertension    Reviewed med, allergy, problem list.   Observations/Objective: No acute distress No labored breathing.  No cough or wheezing.  Normal appearance and mood.   .. Today's Vitals   10/12/19 1018  Temp: 97.7 F (36.5 C)  TempSrc: Oral  Weight: 218 lb (98.9 kg)  Height: 5\' 10"  (1.778 m)   Body mass index is 31.28 kg/m.    Assessment and Plan: .[name] was seen today for sinus problem and headache.  Diagnoses and all orders for this visit:  Acute recurrent maxillary sinusitis -     azithromycin (ZITHROMAX) 250 MG tablet; Take 2 tablets now and then one tablet for 6 days.  Chronic rhinitis   Continue to follow up with ENT. Treated for sinus infection. Continue nasacort and saline rinses.    Follow Up Instructions:    I discussed the assessment and treatment plan with the patient. The patient was provided an opportunity to ask questions and all were answered. The patient agreed with the plan and demonstrated an understanding of the instructions.   The patient was advised to call back or seek an in-person evaluation if the symptoms worsen or if the condition fails to improve as anticipated.     [name], PA-C

## 2019-10-15 ENCOUNTER — Ambulatory Visit (INDEPENDENT_AMBULATORY_CARE_PROVIDER_SITE_OTHER): Payer: 59 | Admitting: Otolaryngology

## 2019-10-19 ENCOUNTER — Other Ambulatory Visit: Payer: Self-pay

## 2019-10-19 ENCOUNTER — Ambulatory Visit (INDEPENDENT_AMBULATORY_CARE_PROVIDER_SITE_OTHER): Payer: 59 | Admitting: Otolaryngology

## 2019-10-19 VITALS — Temp 97.9°F

## 2019-10-19 DIAGNOSIS — J310 Chronic rhinitis: Secondary | ICD-10-CM

## 2019-10-19 DIAGNOSIS — K219 Gastro-esophageal reflux disease without esophagitis: Secondary | ICD-10-CM | POA: Diagnosis not present

## 2019-10-19 NOTE — Progress Notes (Signed)
HPI: Mark Stevens is a 54 y.o. male who returns today for evaluation of similar symptoms he was having 2 months ago.  He complains of chronic postnasal drainage and thick mucus sticking in his throat with globus type symptoms.  FO L was performed at that time and revealed normal upper airway except for arytenoid edema consistent with GE reflux disease.  He has been on omeprazole as well as nasal steroid spray which is used intermittently.  He complains of a thick sticky mucus in his throat but really does not have that much trouble breathing.  He does have some popping in the right ear.  But his main complaint seems to be chronic postnasal drainage..  Past Medical History:  Diagnosis Date  . Bipolar 1 disorder ([place])   . Depression   . Hyperlipidemia   . Hypertension    No past surgical history on file. Social History   Socioeconomic History  . Marital status: Married    Spouse name: Not on file  . Number of children: Not on file  . Years of education: Not on file  . Highest education level: Not on file  Occupational History  . Not on file  Tobacco Use  . Smoking status: Never Smoker  . Smokeless tobacco: Never Used  Substance and Sexual Activity  . Alcohol use: No  . Drug use: No  . Sexual activity: Not Currently    Partners: Female  Other Topics Concern  . Not on file  Social History Narrative  . Not on file   Social Determinants of Health   Financial Resource Strain:   . Difficulty of Paying Living Expenses: Not on file  Food Insecurity:   . Worried About Charity fundraiser in the Last Year: Not on file  . Ran Out of Food in the Last Year: Not on file  Transportation Needs:   . Lack of Transportation (Medical): Not on file  . Lack of Transportation (Non-Medical): Not on file  Physical Activity:   . Days of Exercise per Week: Not on file  . Minutes of Exercise per Session: Not on file  Stress:   . Feeling of Stress : Not on file  Social Connections:   .  Frequency of Communication with Friends and Family: Not on file  . Frequency of Social Gatherings with Friends and Family: Not on file  . Attends Religious Services: Not on file  . Active Member of Clubs or Organizations: Not on file  . Attends Archivist Meetings: Not on file  . Marital Status: Not on file   Family History  Problem Relation Age of Onset  . Prostate cancer Unknown        uncle  . Atrial fibrillation Father   . Congestive Heart Failure Unknown    Allergies  Allergen Reactions  . Epinephrine   . Prednisone     Vision loss   Prior to Admission medications   Medication Sig Start Date End Date Taking? Authorizing Provider  atorvastatin (LIPITOR) 10 MG tablet Take 1 tablet (10 mg total) by mouth daily. 09/21/19  Yes Breeback, [name] L, PA-C  azithromycin (ZITHROMAX) 250 MG tablet Take 2 tablets now and then one tablet for 6 days. 10/12/19  Yes Breeback, [name] L, PA-C  buPROPion (WELLBUTRIN XL) 300 MG 24 hr tablet Take 1 tablet (300 mg total) by mouth daily. 06/29/19  Yes Breeback, [name] L, PA-C  Dapagliflozin-metFORMIN HCl ER (XIGDUO XR) 07-999 MG TB24 Take 1 tablet by mouth daily.  09/21/19  Yes Breeback, [name] L, PA-C  divalproex (DEPAKOTE ER) 500 MG 24 hr tablet Take by mouth 2 (two) times daily.   Yes [provider]  lisinopril (ZESTRIL) 20 MG tablet One tablet by mouth daily for blood pressure control. 09/21/19 09/20/20 Yes Breeback, [name] L, PA-C  omeprazole (PRILOSEC) 40 MG capsule Take 40 mg by mouth daily.   Yes [provider]  sitaGLIPtin (JANUVIA) 100 MG tablet Take 1 tablet (100 mg total) by mouth daily. 09/21/19  Yes Breeback, [name] L, PA-C     Positive ROS: Otherwise negative  All other systems have been reviewed and were otherwise negative with the exception of those mentioned in the HPI and as above.  Physical Exam: Constitutional: Alert, well-appearing, no acute distress Ears: External ears without lesions or tenderness. Ear  canals are clear bilaterally with intact, clear TMs bilaterally.  No middle ear effusion noted.  On tuning fork AC > BC bilaterally. Nasal: External nose without lesions. Septum with mild deviation.  Moderate rhinitis.. Clear nasal passages with clear middle meatus bilaterally.  No polyps noted.  No signs of infection noted. Oral: Lips and gums without lesions. Tongue and palate mucosa without lesions. Posterior oropharynx clear. Neck: No palpable adenopathy or masses Respiratory: Breathing comfortably  Skin: No facial/neck lesions or rash noted.  Procedures  Assessment: GE reflux disease Chronic rhinitis  Plan: Recommended regular use of nasal steroid spray Nasacort 2 sprays each nostril at night. Saline irrigation during the day if he has excessive mucus discharge. Antihistamine as needed. Recommended regular use of omeprazole 40 mg daily before dinner. If symptoms persist beyond 2 months would recommend obtaining CT scan of sinuses.   Radene Journey, MD

## 2019-11-28 ENCOUNTER — Other Ambulatory Visit: Payer: Self-pay | Admitting: Physician Assistant

## 2019-11-28 DIAGNOSIS — I10 Essential (primary) hypertension: Secondary | ICD-10-CM

## 2019-12-21 ENCOUNTER — Other Ambulatory Visit: Payer: Self-pay

## 2019-12-21 ENCOUNTER — Encounter: Payer: Self-pay | Admitting: Physician Assistant

## 2019-12-21 ENCOUNTER — Ambulatory Visit: Payer: 59 | Admitting: Physician Assistant

## 2019-12-21 VITALS — BP 118/82 | HR 117 | Ht 70.0 in | Wt 218.0 lb

## 2019-12-21 DIAGNOSIS — J320 Chronic maxillary sinusitis: Secondary | ICD-10-CM | POA: Diagnosis not present

## 2019-12-21 DIAGNOSIS — E1169 Type 2 diabetes mellitus with other specified complication: Secondary | ICD-10-CM | POA: Diagnosis not present

## 2019-12-21 DIAGNOSIS — J329 Chronic sinusitis, unspecified: Secondary | ICD-10-CM | POA: Diagnosis not present

## 2019-12-21 DIAGNOSIS — J310 Chronic rhinitis: Secondary | ICD-10-CM | POA: Diagnosis not present

## 2019-12-21 LAB — POCT GLYCOSYLATED HEMOGLOBIN (HGB A1C): Hemoglobin A1C: 9 % — AB (ref 4.0–5.6)

## 2019-12-21 MED ORDER — BLOOD GLUCOSE MONITOR KIT: PACK | 0 refills | Status: AC

## 2019-12-21 MED ORDER — SITAGLIPTIN PHOSPHATE 100 MG PO TABS: 100.0000 mg | ORAL_TABLET | Freq: Every day | ORAL | 1 refills | Status: DC

## 2019-12-21 MED ORDER — XIGDUO XR 10-1000 MG PO TB24: 1.0000 | ORAL_TABLET | Freq: Every day | ORAL | 1 refills | Status: DC

## 2019-12-21 NOTE — Progress Notes (Signed)
 Subjective:    Patient ID: Mark Stevens, male    DOB: 08/07/1966, 53 y.o.   MRN: [id number]  HPI  Patient is a 53-year-old male who presents to the clinic for 3-month follow-up on diabetes.  Patient is not checking his sugars regularly.  He admits his diet has not been great.  His father got Covid and started eating and drinking what ever they wanted to feel better.  He admits he has been drinking multiple 7-Up's a day as well as sweet tea.  He also admits to eating gravy biscuits every morning.  He denies any vision changes, open sores or wounds, chest pain.  He is taking Dulera and Januvia daily.  He admits he is not exercising.  Patient continues to have chronic rhinitis and thick discharge that sits at the back of his throat.  He has been going to ENT with no improvement.  They have treated him for GERD symptoms and may be helped a little but continues to have rhinitis.  Has a lot of intermittent sinus and nasal pressure.  Denies any fever, chills, shortness of breath.  He feels like he is constantly clearing his throat.  In the morning for the sputum is very hard and thick.  The only time he gets a little better is with antibiotic and prednisone. He has tried zyrtec and claritin and flonase.   .. Active Ambulatory Problems    Diagnosis Date Noted  . Sleep disturbance 10/16/2013  . Snoring 10/16/2013  . Bipolar 1 disorder (HCC) 10/16/2013  . Obstructive sleep apnea 12/03/2013  . Essential hypertension 11/02/2014  . Hyperglycemia 01/05/2015  . Type 2 diabetes mellitus (HCC) 02/02/2015  . Medial epicondylitis of elbow, left 10/09/2016  . 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 Nephrolithiasis 06/05/2017  . Tinea versicolor 10/16/2017  . Seborrheic keratoses 10/16/2017  . Class 1 obesity due to excess calories with serious comorbidity and body mass index (BMI) of 31.0 to 31.9 in adult 12/06/2017  . Hypertriglyceridemia 03/19/2018  . Gastroesophageal reflux disease without esophagitis  03/10/2019  . Hyperlipidemia LDL goal <70 03/10/2019  . Mixed hyperlipidemia 03/10/2019  . Seborrheic dermatitis 03/10/2019  . Seasonal allergies 06/29/2019  . Sinus drainage 06/29/2019  . Cough 06/29/2019  . Chronic rhinitis 12/21/2019  . Recurrent sinusitis 12/21/2019   Resolved Ambulatory Problems    Diagnosis Date Noted  . Poison ivy 05/05/2015   Past Medical History:  Diagnosis Date  . Depression   . Hyperlipidemia   . Hypertension       Review of Systems See HPI.     Objective:   Physical Exam Vitals reviewed.  Constitutional:      Appearance: Normal appearance. He is obese.  HENT:     Head: Normocephalic.     Right Ear: Tympanic membrane normal.     Left Ear: Tympanic membrane normal.     Nose: Congestion present.     Comments: Constant throat clearing.     Mouth/Throat:     Mouth: Mucous membranes are moist.  Eyes:     Extraocular Movements: Extraocular movements intact.     Conjunctiva/sclera: Conjunctivae normal.     Pupils: Pupils are equal, round, and reactive to light.  Cardiovascular:     Rate and Rhythm: Normal rate and regular rhythm.     Pulses: Normal pulses.     Heart sounds: Normal heart sounds.  Pulmonary:     Effort: Pulmonary effort is normal.  Neurological:     General: No focal deficit present.       Mental Status: He is alert and oriented to person, place, and time.  Psychiatric:        Mood and Affect: Mood normal.           Assessment & Plan:  ..Mark Stevens was seen today for diabetes.  Diagnoses and all orders for this visit:  Type 2 diabetes mellitus with other specified complication, without long-term current use of insulin (HCC) -     POCT glycosylated hemoglobin (Hb A1C) -     Dapagliflozin-metFORMIN HCl ER (XIGDUO XR) 07-999 MG TB24; Take 1 tablet by mouth daily. -     sitaGLIPtin (JANUVIA) 100 MG tablet; Take 1 tablet (100 mg total) by mouth daily. -     blood glucose meter kit and supplies KIT; Dispense based on patient  and insurance preference. Use up to four times daily as directed. (FOR ICD-9 250.00, 250.01).  Chronic rhinitis -     CT MAXILLOFACIAL LTD WO CM  Recurrent sinusitis -     CT MAXILLOFACIAL LTD WO CM  Chronic maxillary sinusitis -     CT MAXILLOFACIAL LTD WO CM   .. Results for orders placed or performed in visit on 12/21/19  POCT glycosylated hemoglobin (Hb A1C)  Result Value Ref Range   Hemoglobin A1C 9.0 (A) 4.0 - 5.6 %   HbA1c POC (<> result, manual entry)     HbA1c, POC (prediabetic range)     HbA1c, POC (controlled diabetic range)     A1c is not controlled. Patient admits he has made lots of bad food and drink choices over the past 3 months he would like the next month to make some changes before changing his medication.  I agreed. I did send him a blood glucose monitor that he needs to start checking his fasting morning glucose and keep a record of this for the next month.  We will follow-up by phone to review. Continue to do and Januvia. On statin. On ACE blood pressure controlled. Reminder that he needs yearly eye exam. Flu and pneumonia shot up-to-date.  Patient does have chronic sinusitis/rhinitis symptoms for the last year.  We will get CT of sinuses.  Tried antihistamines, PPIs, nasal steroid, antihistamine nasal spray with no relief. 

## 2019-12-23 ENCOUNTER — Ambulatory Visit (INDEPENDENT_AMBULATORY_CARE_PROVIDER_SITE_OTHER): Payer: 59

## 2019-12-23 ENCOUNTER — Other Ambulatory Visit: Payer: Self-pay

## 2019-12-23 ENCOUNTER — Telehealth: Payer: Self-pay

## 2019-12-23 DIAGNOSIS — J310 Chronic rhinitis: Secondary | ICD-10-CM | POA: Diagnosis not present

## 2019-12-23 DIAGNOSIS — J329 Chronic sinusitis, unspecified: Secondary | ICD-10-CM | POA: Diagnosis not present

## 2019-12-23 DIAGNOSIS — J320 Chronic maxillary sinusitis: Secondary | ICD-10-CM

## 2019-12-23 DIAGNOSIS — G4733 Obstructive sleep apnea (adult) (pediatric): Secondary | ICD-10-CM

## 2019-12-23 MED ORDER — AMBULATORY NON FORMULARY MEDICATION: 0 refills | Status: AC

## 2019-12-23 NOTE — Telephone Encounter (Signed)
Order faxed to [facility] at [phone] with confirmation received.

## 2019-12-23 NOTE — Telephone Encounter (Signed)
Spoke with patient, he just needs updated supplies. Order written. Will send to [facility].

## 2019-12-23 NOTE — Telephone Encounter (Signed)
I haven't sent anything for CPAP. It looks like last records of CPAP or sleep study is from 2015 with Dr. Ileene Rubens, was this discussed at appt yesterday?

## 2019-12-23 NOTE — Telephone Encounter (Signed)
I do not remember talking to him about this. Only a CT scan for sinuses. He is on a CPAP he may just need supplies etc?

## 2019-12-23 NOTE — Telephone Encounter (Signed)
[facility] called stating there may have been a mix up with his CPAP paperwork from [place]. He believes the paperwork came over with Dr Hommel's name. I called [facility] and left a detailed message asking for a return call.   [facility] equipment supplier in [place], [place]  Address: [address], [place], [place] [address] Phone: [phone]

## 2019-12-24 NOTE — Progress Notes (Signed)
Call pt:   You have a few abnormalities:  Mild rightward septal deviation, minor right frontal sinus opacification, small retention cysts in the inferior maxillary sinuses. There is NO sinus outflow obstruction. No major findings but enough I want a ENT to look at. Do you want me to send to Dr. Lucia Gaskins or you just want the second opinion.

## 2020-01-01 ENCOUNTER — Encounter (INDEPENDENT_AMBULATORY_CARE_PROVIDER_SITE_OTHER): Payer: Self-pay | Admitting: Otolaryngology

## 2020-01-01 ENCOUNTER — Ambulatory Visit (INDEPENDENT_AMBULATORY_CARE_PROVIDER_SITE_OTHER): Payer: 59 | Admitting: Otolaryngology

## 2020-01-01 ENCOUNTER — Other Ambulatory Visit: Payer: Self-pay

## 2020-01-01 VITALS — Temp 97.7°F

## 2020-01-01 DIAGNOSIS — J310 Chronic rhinitis: Secondary | ICD-10-CM | POA: Diagnosis not present

## 2020-01-01 NOTE — Progress Notes (Signed)
HPI: Mark Stevens is a 54 y.o. male who returns today for evaluation of chronic complaints of thick mucus postnasal drainage as well as thick mucus building up in his throat and points to the region of the larynx.  He denies a sore throat.  He states that the mucus builds up especially at night when he has a cough sometimes to clear his throat.  He has been on omeprazole now for about 53month.  He is use the nasal steroid spray regularly.  He presents today to review the CT scan of the sinuses.  He inquired about some abnormality noted on the reading consisting of septal deviation to the right as well as cyst within the maxillary sinuses.  He has had no fever no yellow-green discharge he really does not describe that much trouble breathing through his nose..[name] Past Medical History:  Diagnosis Date  . [place] 1 disorder ([place]   . Depression   . Hyperlipidemia   . Hypertension    No past surgical history on file. Social History   Socioeconomic History  . Marital status: Married    Spouse name: Not on file  . Number of children: Not on file  . Years of education: Not on file  . Highest education level: Not on file  Occupational History  . Not on file  Tobacco Use  . Smoking status: Never Smoker  . Smokeless tobacco: Never Used  Substance and Sexual Activity  . Alcohol use: No  . Drug use: No  . Sexual activity: Not Currently    Partners: Female  Other Topics Concern  . Not on file  Social History Narrative  . Not on file   Social Determinants of Health   Financial Resource Strain:   . Difficulty of Paying Living Expenses:   Food Insecurity:   . Worried About RCharity fundraiserin the Last Year:   . RArboriculturistin the Last Year:   Transportation Needs:   . LFilm/video editor(Medical):   .Marland KitchenLack of Transportation (Non-Medical):   Physical Activity:   . Days of Exercise per Week:   . Minutes of Exercise per Session:   Stress:   . Feeling of Stress :   Social  Connections:   . Frequency of Communication with Friends and Family:   . Frequency of Social Gatherings with Friends and Family:   . Attends Religious Services:   . Active Member of Clubs or Organizations:   . Attends CArchivistMeetings:   .Marland KitchenMarital Status:    Family History  Problem Relation Age of Onset  . Prostate cancer Unknown        uncle  . Atrial fibrillation Father   . Congestive Heart Failure Unknown    Allergies  Allergen Reactions  . Epinephrine   . Prednisone     Vision loss   Prior to Admission medications   Medication Sig Start Date End Date Taking? Authorizing Provider  AMBULATORY NON FORMULARY MEDICATION CPAP supplies (mask/strap/tubing) DX: G47.33 12/23/19  Yes Breeback, [name] L, PA-C  atorvastatin (LIPITOR) 10 MG tablet Take 1 tablet (10 mg total) by mouth daily. 09/21/19  Yes Breeback, [name] L, PA-C  blood glucose meter kit and supplies KIT Dispense based on patient and insurance preference. Use up to four times daily as directed. (FOR ICD-9 250.00, 250.01). 12/21/19  Yes Breeback, [name] L, PA-C  buPROPion (WELLBUTRIN XL) 300 MG 24 hr tablet Take 1 tablet (300 mg total) by mouth  daily. 06/29/19  Yes Breeback, [name] L, PA-C  Dapagliflozin-metFORMIN HCl ER (XIGDUO XR) 07-999 MG TB24 Take 1 tablet by mouth daily. 12/21/19  Yes Breeback, [name] L, PA-C  divalproex (DEPAKOTE ER) 500 MG 24 hr tablet Take by mouth 2 (two) times daily.   Yes [provider]  lisinopril (ZESTRIL) 20 MG tablet One tablet by mouth daily for blood pressure control. 09/21/19 09/20/20 Yes Breeback, [name] L, PA-C  sitaGLIPtin (JANUVIA) 100 MG tablet Take 1 tablet (100 mg total) by mouth daily. 12/21/19  Yes Breeback, [name] L, PA-C     Positive ROS: Otherwise negative  All other systems have been reviewed and were otherwise negative with the exception of those mentioned in the HPI and as above.  Physical Exam: Constitutional: Alert, well-appearing, no acute distress Ears: External  ears without lesions or tenderness. Ear canals are clear bilaterally with intact, clear TMs.  Nasal: External nose without lesions. Septum slight septal deviation to the right with mild rhinitis.. Clear nasal passages otherwise with no significant mucus buildup which is clear. Oral: Lips and gums without lesions. Tongue and palate mucosa without lesions. Posterior oropharynx clear.  Patient has a strong gag reflex. Neck: No palpable adenopathy or masses Respiratory: Breathing comfortably  Skin: No facial/neck lesions or rash noted.  Procedures  Assessment: Chronic rhinitis  Plan: Reviewed the CT scan with the patient in the office today and answered questions concerning the retention cyst in the maxillary sinuses as well as a septal deviation to the right.  No signs of obstruction of the sinuses or sinus disease. Discussed with patient that this will be more medical treatment with regular use of Nasacort 2 sprays each nostril at night as well as a saline irrigations and suggested trying Xlear rinse. Concerning the omeprazole if he is not having benefit from use of the omeprazole would recommend stopping this. He will follow-up as needed.   Radene Journey, MD

## 2020-01-08 ENCOUNTER — Telehealth: Payer: Self-pay | Admitting: Physician Assistant

## 2020-01-08 MED ORDER — BLOOD GLUCOSE METER KIT: PACK | 0 refills | Status: DC

## 2020-01-08 NOTE — Telephone Encounter (Signed)
Ok to send over rx for new glucometer.

## 2020-01-08 NOTE — Telephone Encounter (Signed)
Patient aware. New RX written.

## 2020-01-08 NOTE — Telephone Encounter (Signed)
PT states that his glucometer does not work right and when he puts the strips in the machine, it does not want to give him a reading and sometimes it takes several strips and tries for him to get a blood sugar reading. Please advise pt

## 2020-01-21 ENCOUNTER — Telehealth (INDEPENDENT_AMBULATORY_CARE_PROVIDER_SITE_OTHER): Payer: 59 | Admitting: Physician Assistant

## 2020-01-21 ENCOUNTER — Encounter: Payer: Self-pay | Admitting: Physician Assistant

## 2020-01-21 DIAGNOSIS — E1169 Type 2 diabetes mellitus with other specified complication: Secondary | ICD-10-CM

## 2020-01-21 NOTE — Progress Notes (Signed)
Patient ID: Mark Stevens, male   DOB: December 25, 1965, 54 y.o.   MRN: [id number] .Marland KitchenVirtual Visit via Telephone Note  I connected with Mark Stevens on 01/21/20 at  8:10 AM EDT by telephone and verified that I am speaking with the correct person using two identifiers.  Location: Patient: home Provider: clinic   I discussed the limitations, risks, security and privacy concerns of performing an evaluation and management service by telephone and the availability of in person appointments. I also discussed with the patient that there may be a patient responsible charge related to this service. The patient expressed understanding and agreed to proceed.   History of Present Illness: Pt is a 54 yo male who presents to discuss blood sugars after high reading A1C 9.0 on 3/22. He is on [place]. He has limited carbs and sugars.   First thing in morning sugars:  March 24 154 March 26 160 March 27 155 March 29 123 March 31 144 April 2nd 124 April 5 159 April 6 113 April 10 121 April 12 131 April 14 111 April 16 108 April 18 103 April 20 93  .[name] Active Ambulatory Problems    Diagnosis Date Noted  . Sleep disturbance 10/16/2013  . Snoring 10/16/2013  . Bipolar 1 disorder ([place]) 10/16/2013  . Obstructive sleep apnea 12/03/2013  . Essential hypertension 11/02/2014  . Hyperglycemia 01/05/2015  . Type 2 diabetes mellitus ([place]) 02/02/2015  . Medial epicondylitis of elbow, left 10/09/2016  . 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 Nephrolithiasis 06/05/2017  . Tinea versicolor 10/16/2017  . Seborrheic keratoses 10/16/2017  . Class 1 obesity due to excess calories with serious comorbidity and body mass index (BMI) of 31.0 to 31.9 in adult 12/06/2017  . Hypertriglyceridemia 03/19/2018  . Gastroesophageal reflux disease without esophagitis 03/10/2019  . Hyperlipidemia LDL goal <70 03/10/2019  . Mixed hyperlipidemia 03/10/2019  . Seborrheic dermatitis 03/10/2019  . Seasonal allergies  06/29/2019  . Sinus drainage 06/29/2019  . Cough 06/29/2019  . Chronic rhinitis 12/21/2019  . Recurrent sinusitis 12/21/2019   Resolved Ambulatory Problems    Diagnosis Date Noted  . Poison ivy 05/05/2015   Past Medical History:  Diagnosis Date  . Depression   . Hyperlipidemia   . Hypertension    Reviewed med, allergies, problem.    Observations/Objective: No acute changes.  Normal mood.   No vitals obtained.    Assessment and Plan: .[name] was seen today for diabetes.  Diagnoses and all orders for this visit:  Type 2 diabetes mellitus with other specified complication, without long-term current use of insulin ([place])   Encouraged by readings. Doing a great job with diet.  Keep up good work.  Discussed more about diabetic diet.  Follow up in 2 months with A1C.   Follow Up Instructions:    I discussed the assessment and treatment plan with the patient. The patient was provided an opportunity to ask questions and all were answered. The patient agreed with the plan and demonstrated an understanding of the instructions.   The patient was advised to call back or seek an in-person evaluation if the symptoms worsen or if the condition fails to improve as anticipated.  I provided 15 minutes of non-face-to-face time during this encounter.   [name], PA-C

## 2020-01-25 ENCOUNTER — Ambulatory Visit: Payer: 59 | Admitting: Physician Assistant

## 2020-02-09 ENCOUNTER — Telehealth: Payer: Self-pay | Admitting: Neurology

## 2020-02-09 NOTE — Telephone Encounter (Signed)
Called patient, advised him that that is the price with insurance because it would be significantly more if insurance wasn't covering this. Advised patient he may have a deductible to meet, he states he is sure he doesn't, but also states he didn't confirm this with his insurance.   Advised patient to go on website and get coupon. Patient agreeable.

## 2020-02-09 NOTE — Telephone Encounter (Signed)
Tried to call patient back, but states he just spoke with Apolonio Schneiders.

## 2020-02-09 NOTE — Telephone Encounter (Signed)
Patient left vm stating his Januvia is usually $5 for a 3 month supply but is now $65. Wants to discuss.

## 2020-03-22 ENCOUNTER — Ambulatory Visit: Payer: 59 | Admitting: Physician Assistant

## 2020-03-22 ENCOUNTER — Encounter: Payer: Self-pay | Admitting: Physician Assistant

## 2020-03-22 ENCOUNTER — Other Ambulatory Visit: Payer: Self-pay

## 2020-03-22 VITALS — BP 135/84 | HR 71 | Ht 70.0 in | Wt 195.0 lb

## 2020-03-22 DIAGNOSIS — E1169 Type 2 diabetes mellitus with other specified complication: Secondary | ICD-10-CM | POA: Diagnosis not present

## 2020-03-22 DIAGNOSIS — I10 Essential (primary) hypertension: Secondary | ICD-10-CM | POA: Diagnosis not present

## 2020-03-22 DIAGNOSIS — E785 Hyperlipidemia, unspecified: Secondary | ICD-10-CM

## 2020-03-22 DIAGNOSIS — Z1159 Encounter for screening for other viral diseases: Secondary | ICD-10-CM

## 2020-03-22 LAB — POCT GLYCOSYLATED HEMOGLOBIN (HGB A1C): Hemoglobin A1C: 5.5 % (ref 4.0–5.6)

## 2020-03-22 MED ORDER — LISINOPRIL 20 MG PO TABS: ORAL_TABLET | ORAL | 1 refills | Status: DC

## 2020-03-22 MED ORDER — SITAGLIPTIN PHOSPHATE 100 MG PO TABS: 100.0000 mg | ORAL_TABLET | Freq: Every day | ORAL | 1 refills | Status: DC

## 2020-03-22 MED ORDER — XIGDUO XR 10-1000 MG PO TB24: 1.0000 | ORAL_TABLET | Freq: Every day | ORAL | 1 refills | Status: DC

## 2020-03-22 MED ORDER — ATORVASTATIN CALCIUM 10 MG PO TABS: 10.0000 mg | ORAL_TABLET | Freq: Every day | ORAL | 1 refills | Status: DC

## 2020-03-22 NOTE — Progress Notes (Signed)
Subjective:    Patient ID: Mark Stevens, male    DOB: 1965/10/14, 54 y.o.   MRN: [id number]  HPI Patient is a 54 year old male with type 2 diabetes, hypertension, hyperlipidemia who presents to the clinic for 28-month follow-up.  His last A1c was 9.  He has made significant diet changes and is exercising.  He has lost 23 pounds.  Is very motivated to change.  He continues to take his medications daily.  He is not checking his sugars at home.  He denies any hypoglycemic events.  He denies any open sores or wounds.  He denies any chest pain, palpitations, headaches.  .. Active Ambulatory Problems    Diagnosis Date Noted  . Sleep disturbance 10/16/2013  . Snoring 10/16/2013  . Bipolar 1 disorder ([place]) 10/16/2013  . Obstructive sleep apnea 12/03/2013  . Essential hypertension 11/02/2014  . Hyperglycemia 01/05/2015  . Type 2 diabetes mellitus ([place]) 02/02/2015  . Medial epicondylitis of elbow, left 10/09/2016  . 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 Nephrolithiasis 06/05/2017  . Tinea versicolor 10/16/2017  . Seborrheic keratoses 10/16/2017  . Class 1 obesity due to excess calories with serious comorbidity and body mass index (BMI) of 31.0 to 31.9 in adult 12/06/2017  . Hypertriglyceridemia 03/19/2018  . Gastroesophageal reflux disease without esophagitis 03/10/2019  . Hyperlipidemia LDL goal <70 03/10/2019  . Mixed hyperlipidemia 03/10/2019  . Seborrheic dermatitis 03/10/2019  . Seasonal allergies 06/29/2019  . Sinus drainage 06/29/2019  . Cough 06/29/2019  . Chronic rhinitis 12/21/2019  . Recurrent sinusitis 12/21/2019   Resolved Ambulatory Problems    Diagnosis Date Noted  . Poison ivy 05/05/2015   Past Medical History:  Diagnosis Date  . Depression   . Hyperlipidemia   . Hypertension     Review of Systems  All other systems reviewed and are negative.      Objective:   Physical Exam Vitals reviewed.  Constitutional:      Appearance: Normal appearance.  HENT:     Head:  Normocephalic.  Cardiovascular:     Rate and Rhythm: Normal rate and regular rhythm.     Pulses: Normal pulses.  Pulmonary:     Effort: Pulmonary effort is normal.     Breath sounds: Normal breath sounds.  Neurological:     General: No focal deficit present.     Mental Status: He is alert and oriented to person, place, and time.  Psychiatric:        Mood and Affect: Mood normal.           Assessment & Plan:  .[name] was seen today for diabetes.  Diagnoses and all orders for this visit:  Type 2 diabetes mellitus with other specified complication, without long-term current use of insulin (HCC) -     POCT glycosylated hemoglobin (Hb A1C) -     COMPLETE METABOLIC PANEL WITH GFR -     Dapagliflozin-metFORMIN HCl ER (XIGDUO XR) 07-999 MG TB24; Take 1 tablet by mouth daily. -     sitaGLIPtin (JANUVIA) 100 MG tablet; Take 1 tablet (100 mg total) by mouth daily.  Essential hypertension -     COMPLETE METABOLIC PANEL WITH GFR -     lisinopril (ZESTRIL) 20 MG tablet; One tablet by mouth daily for blood pressure control.  Hyperlipidemia LDL goal <70 -     Lipid Panel w/reflex Direct LDL -     atorvastatin (LIPITOR) 10 MG tablet; Take 1 tablet (10 mg total) by mouth daily.  Encounter for hepatitis C  screening test for low risk patient -     Hepatitis C Antibody    Lab Results  Component Value Date   HGBA1C 5.5 03/22/2020   A1C to goal. GREAT job.  Continue medications. Sent refill.  On ACE. BP to goal.  On statin. Lipid ordered.  Eye exam UTD.  Declined vaccines.  Follow up in 3 months.  need

## 2020-03-22 NOTE — Progress Notes (Signed)
Last weight 218, today's weight 195

## 2020-04-09 LAB — HEPATITIS C ANTIBODY
Hepatitis C Ab: NONREACTIVE
SIGNAL TO CUT-OFF: 0.01 (ref <1.00)

## 2020-04-09 LAB — COMPLETE METABOLIC PANEL WITH GFR
AG Ratio: 2.1 (calc) (ref 1.0–2.5)
ALT: 13 U/L (ref 9–46)
AST: 15 U/L (ref 10–35)
Albumin: 4.7 g/dL (ref 3.6–5.1)
Alkaline phosphatase (APISO): 57 U/L (ref 35–144)
BUN: 19 mg/dL (ref 7–25)
CO2: 25 mmol/L (ref 20–32)
Calcium: 9.7 mg/dL (ref 8.6–10.3)
Chloride: 102 mmol/L (ref 98–110)
Creat: 1.29 mg/dL (ref 0.70–1.33)
GFR, Est African American: 72 mL/min/{1.73_m2} (ref >=60)
GFR, Est Non African American: 62 mL/min/{1.73_m2} (ref >=60)
Globulin: 2.2 g/dL (calc) (ref 1.9–3.7)
Glucose, Bld: 101 mg/dL — ABNORMAL HIGH (ref 65–99)
Potassium: 4.3 mmol/L (ref 3.5–5.3)
Sodium: 138 mmol/L (ref 135–146)
Total Bilirubin: 0.8 mg/dL (ref 0.2–1.2)
Total Protein: 6.9 g/dL (ref 6.1–8.1)

## 2020-04-09 LAB — LIPID PANEL W/REFLEX DIRECT LDL
Cholesterol: 139 mg/dL (ref <200)
HDL: 66 mg/dL (ref >=40)
LDL Cholesterol (Calc): 57 mg/dL (calc)
Non-HDL Cholesterol (Calc): 73 mg/dL (calc) (ref <130)
Total CHOL/HDL Ratio: 2.1 (calc) (ref <5.0)
Triglycerides: 77 mg/dL (ref <150)

## 2020-04-11 NOTE — Progress Notes (Signed)
Michell,   Cholesterol looks fabulous! Kidney, liver look great!  So proud of all your changes.

## 2020-06-21 ENCOUNTER — Telehealth: Payer: Self-pay | Admitting: Neurology

## 2020-06-21 NOTE — Telephone Encounter (Signed)
Patient called and states wife tested positive for Covid (results given on Sunday). He wanted information about testing and getting antibodies tested. I let him know if he wanted tested here we would need a virtual visit (currently no symptoms). Also made aware he can get tested at pharmacies and different Covid testing sites. He will check around and let us know if he wants appt here. He does have visit on 06/27/2020 and made him aware he would need clear Covid test before coming into the office.

## 2020-06-27 ENCOUNTER — Ambulatory Visit (INDEPENDENT_AMBULATORY_CARE_PROVIDER_SITE_OTHER): Payer: 59 | Admitting: Physician Assistant

## 2020-06-27 ENCOUNTER — Other Ambulatory Visit: Payer: Self-pay

## 2020-06-27 ENCOUNTER — Encounter: Payer: Self-pay | Admitting: Physician Assistant

## 2020-06-27 VITALS — BP 117/76 | HR 87 | Ht 70.0 in | Wt 178.0 lb

## 2020-06-27 DIAGNOSIS — E785 Hyperlipidemia, unspecified: Secondary | ICD-10-CM | POA: Diagnosis not present

## 2020-06-27 DIAGNOSIS — E1169 Type 2 diabetes mellitus with other specified complication: Secondary | ICD-10-CM

## 2020-06-27 DIAGNOSIS — I10 Essential (primary) hypertension: Secondary | ICD-10-CM | POA: Diagnosis not present

## 2020-06-27 LAB — POCT GLYCOSYLATED HEMOGLOBIN (HGB A1C): Hemoglobin A1C: 4.9 % (ref 4.0–5.6)

## 2020-06-27 NOTE — Progress Notes (Addendum)
Subjective:    Patient ID: Mark Stevens, male    DOB: 04/09/1966, 54 y.o.   MRN: [id number]  Diabetes  54 year old male presenting for DM follow-up and A1c check. States that he has been doing well with his diet and being active the majority of the time and has been losing weight regularly. Taking xigduo and [place] regularly as prescribed without side effects and is tolerating them well. He says he feels good and is overall happy and healthy. Denies SOB palpitations, diarrhea, blood in stool. No hypoglycemic events. He continues to take lisinopril for HTN. Pt does not check sugars or BP at home.   Mood well controlled.   .. Active Ambulatory Problems    Diagnosis Date Noted  . Sleep disturbance 10/16/2013  . Snoring 10/16/2013  . Bipolar 1 disorder ([place]) 10/16/2013  . Obstructive sleep apnea 12/03/2013  . Essential hypertension 11/02/2014  . Hyperglycemia 01/05/2015  . Type 2 diabetes mellitus ([place]) 02/02/2015  . Medial epicondylitis of elbow, left 10/09/2016  . 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 Nephrolithiasis 06/05/2017  . Tinea versicolor 10/16/2017  . Seborrheic keratoses 10/16/2017  . Class 1 obesity due to excess calories with serious comorbidity and body mass index (BMI) of 31.0 to 31.9 in adult 12/06/2017  . Hypertriglyceridemia 03/19/2018  . Gastroesophageal reflux disease without esophagitis 03/10/2019  . Hyperlipidemia LDL goal <70 03/10/2019  . Mixed hyperlipidemia 03/10/2019  . Seborrheic dermatitis 03/10/2019  . Seasonal allergies 06/29/2019  . Sinus drainage 06/29/2019  . Cough 06/29/2019  . Chronic rhinitis 12/21/2019  . Recurrent sinusitis 12/21/2019   Resolved Ambulatory Problems    Diagnosis Date Noted  . Poison ivy 05/05/2015   Past Medical History:  Diagnosis Date  . Depression   . Hyperlipidemia   . Hypertension         Review of Systems  All other systems reviewed and are negative.      Objective:   Physical Exam Constitutional:       Appearance: Normal appearance. He is normal weight.  Cardiovascular:     Rate and Rhythm: Normal rate and regular rhythm.     Pulses: Normal pulses.     Heart sounds: Normal heart sounds.  Pulmonary:     Effort: Pulmonary effort is normal.     Breath sounds: Normal breath sounds.  Neurological:     Mental Status: He is alert.         Assessment & Plan:  .[name] was seen today for diabetes.  Diagnoses and all orders for this visit:  Type 2 diabetes mellitus with other specified complication, without long-term current use of insulin (HCC) -     POCT glycosylated hemoglobin (Hb A1C)  Essential hypertension  Hyperlipidemia LDL goal <70  .[name] Results for orders placed or performed in visit on 06/27/20  POCT glycosylated hemoglobin (Hb A1C)  Result Value Ref Range   Hemoglobin A1C 4.9 4.0 - 5.6 %   HbA1c POC (<> result, manual entry)     HbA1c, POC (prediabetic range)     HbA1c, POC (controlled diabetic range)     A1C is to goal and BETTER. Ok to stop Januvia.  Continue xigduo for now. BP to goal. Continue ACE. On statin. LDL to goal. UTD lipid level. Continue with diet and exercise.  Needs eye exam. Foot exam UTD. Covid vaccine 1st shot completed.  Pt declined flu and pneumonia vaccine. Follow up in 3 months.   Marland KitchenVernetta Honey PA-C, have reviewed and agree with  the above documentation in it's entirety.

## 2020-09-26 ENCOUNTER — Encounter: Payer: Self-pay | Admitting: Physician Assistant

## 2020-09-26 ENCOUNTER — Ambulatory Visit: Payer: 59 | Admitting: Physician Assistant

## 2020-09-26 VITALS — BP 118/74 | HR 79 | Wt 189.0 lb

## 2020-09-26 DIAGNOSIS — F319 Bipolar disorder, unspecified: Secondary | ICD-10-CM | POA: Diagnosis not present

## 2020-09-26 DIAGNOSIS — E785 Hyperlipidemia, unspecified: Secondary | ICD-10-CM

## 2020-09-26 DIAGNOSIS — E1169 Type 2 diabetes mellitus with other specified complication: Secondary | ICD-10-CM | POA: Diagnosis not present

## 2020-09-26 DIAGNOSIS — I10 Essential (primary) hypertension: Secondary | ICD-10-CM | POA: Diagnosis not present

## 2020-09-26 DIAGNOSIS — K219 Gastro-esophageal reflux disease without esophagitis: Secondary | ICD-10-CM

## 2020-09-26 LAB — POCT GLYCOSYLATED HEMOGLOBIN (HGB A1C): Hemoglobin A1C: 5.2 % (ref 4.0–5.6)

## 2020-09-26 MED ORDER — LISINOPRIL 20 MG PO TABS: ORAL_TABLET | ORAL | 1 refills | Status: DC

## 2020-09-26 MED ORDER — DIVALPROEX SODIUM ER 500 MG PO TB24: 500.0000 mg | ORAL_TABLET | Freq: Every day | ORAL | 1 refills | Status: DC

## 2020-09-26 MED ORDER — XIGDUO XR 10-1000 MG PO TB24: 1.0000 | ORAL_TABLET | Freq: Every day | ORAL | 1 refills | Status: DC

## 2020-09-26 MED ORDER — ATORVASTATIN CALCIUM 10 MG PO TABS: 10.0000 mg | ORAL_TABLET | Freq: Every day | ORAL | 1 refills | Status: DC

## 2020-09-26 NOTE — Patient Instructions (Signed)

## 2020-09-26 NOTE — Progress Notes (Signed)
Subjective:    Patient ID: Mark Stevens, male    DOB: 06-Jul-1966, 54 y.o.   MRN: [id number]  HPI  Pt is a 54 yo male with T2DM, HTN, HLD who presents to the clinic for 3 month follow up.   He is doing great!. He has no concerns or complaints. He has been eating a little more and gained some weight. Admits to making a few more "bad choices". He denies any hypoglycemic events. He is not checking sugars. He is staying active but not exercising.   He is taking all of his medication. He denies any CP, palpitations, headaches, or vision concerns.   .. Active Ambulatory Problems    Diagnosis Date Noted  . Sleep disturbance 10/16/2013  . Snoring 10/16/2013  . Bipolar 1 disorder (HCC) 10/16/2013  . Obstructive sleep apnea 12/03/2013  . Essential hypertension 11/02/2014  . Hyperglycemia 01/05/2015  . Type 2 diabetes mellitus (HCC) 02/02/2015  . Medial epicondylitis of elbow, left 10/09/2016  . 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 Nephrolithiasis 06/05/2017  . Tinea versicolor 10/16/2017  . Seborrheic keratoses 10/16/2017  . Hypertriglyceridemia 03/19/2018  . Gastroesophageal reflux disease without esophagitis 03/10/2019  . Hyperlipidemia LDL goal <70 03/10/2019  . Mixed hyperlipidemia 03/10/2019  . Seborrheic dermatitis 03/10/2019  . Seasonal allergies 06/29/2019  . Sinus drainage 06/29/2019  . Cough 06/29/2019  . Chronic rhinitis 12/21/2019  . Recurrent sinusitis 12/21/2019   Resolved Ambulatory Problems    Diagnosis Date Noted  . Poison ivy 05/05/2015  . Class 1 obesity due to excess calories with serious comorbidity and body mass index (BMI) of 31.0 to 31.9 in adult 12/06/2017   Past Medical History:  Diagnosis Date  . Depression   . Hyperlipidemia   . Hypertension       Review of Systems  All other systems reviewed and are negative.      Objective:   Physical Exam Vitals reviewed.  Constitutional:      Appearance: Normal appearance.  Neck:     Vascular: No  carotid bruit.  Cardiovascular:     Rate and Rhythm: Normal rate and regular rhythm.     Pulses: Normal pulses.     Heart sounds: Normal heart sounds.  Pulmonary:     Effort: Pulmonary effort is normal.     Breath sounds: Normal breath sounds.  Neurological:     General: No focal deficit present.     Mental Status: He is alert and oriented to person, place, and time.  Psychiatric:        Mood and Affect: Mood normal.           Assessment & Plan:  .[name] was seen today for diabetes.  Diagnoses and all orders for this visit:  Type 2 diabetes mellitus with other specified complication, without long-term current use of insulin (HCC) -     POCT glycosylated hemoglobin (Hb A1C) -     Dapagliflozin-metFORMIN HCl ER (XIGDUO XR) 07-999 MG TB24; Take 1 tablet by mouth daily.  Essential hypertension -     lisinopril (ZESTRIL) 20 MG tablet; One tablet by mouth daily for blood pressure control.  Gastroesophageal reflux disease without esophagitis  Bipolar 1 disorder (HCC) -     divalproex (DEPAKOTE ER) 500 MG 24 hr tablet; Take 1 tablet (500 mg total) by mouth daily. -     divalproex (DEPAKOTE ER) 500 MG 24 hr tablet; Take 1 tablet (500 mg total) by mouth 2 (two) times daily.  Hyperlipidemia LDL goal <70 -  atorvastatin (LIPITOR) 10 MG tablet; Take 1 tablet (10 mg total) by mouth daily.    Results for orders placed or performed in visit on 09/26/20  POCT glycosylated hemoglobin (Hb A1C)  Result Value Ref Range   Hemoglobin A1C 5.2 4.0 - 5.6 %   HbA1c POC (<> result, manual entry)     HbA1c, POC (prediabetic range)     HbA1c, POC (controlled diabetic range)     A1C up just a tad.  Continue on same medications.  Discussed diabetic diet.  On ACE. BP to goal.  On Statin.  Declined eye exam.  Declined pneumonia and flu shot.  He does have both covid vaccines. He is not due for booster yet.   Follow up 6 months.

## 2020-09-27 MED ORDER — DIVALPROEX SODIUM ER 500 MG PO TB24: 500.0000 mg | ORAL_TABLET | Freq: Two times a day (BID) | ORAL | 1 refills | Status: DC

## 2021-02-13 ENCOUNTER — Telehealth: Payer: Self-pay

## 2021-02-13 DIAGNOSIS — B360 Pityriasis versicolor: Secondary | ICD-10-CM

## 2021-02-13 MED ORDER — KETOCONAZOLE 2 % EX CREA: 1.0000 "application " | TOPICAL_CREAM | Freq: Two times a day (BID) | CUTANEOUS | 3 refills | Status: DC

## 2021-02-13 NOTE — Telephone Encounter (Signed)
Mark Stevens called and he has the rash again and wanted [name] to call in the ointment/cream she has in the past. He refused an appointment. Please advise.   I did see a past prescription for Ketoconazole.

## 2021-02-13 NOTE — Telephone Encounter (Signed)
Patient advised.

## 2021-02-13 NOTE — Telephone Encounter (Signed)
I sent ketoconazole cream refill.

## 2021-02-14 ENCOUNTER — Telehealth: Payer: Self-pay | Admitting: Physician Assistant

## 2021-02-14 DIAGNOSIS — B360 Pityriasis versicolor: Secondary | ICD-10-CM

## 2021-02-14 MED ORDER — FLUCONAZOLE 150 MG PO TABS: ORAL_TABLET | ORAL | 0 refills | Status: DC

## 2021-02-14 NOTE — Telephone Encounter (Signed)
The only thing I see on past medication list this could be would be Diflucan? It looks like last RX was 2020. Please advise?

## 2021-02-14 NOTE — Telephone Encounter (Signed)
Pt states you called in a cream for him yesterday for his break out he gets, but he states its usually a pill for this and would like for you to switch it to the pill. Please advise pt

## 2021-02-14 NOTE — Telephone Encounter (Signed)
Sent diflucan for tinea veriscolor.

## 2021-03-27 ENCOUNTER — Ambulatory Visit: Payer: 59 | Admitting: Physician Assistant

## 2021-03-27 ENCOUNTER — Other Ambulatory Visit: Payer: Self-pay

## 2021-03-27 ENCOUNTER — Encounter: Payer: Self-pay | Admitting: Physician Assistant

## 2021-03-27 VITALS — BP 106/66 | HR 75 | Temp 98.1°F | Resp 20 | Ht 70.0 in | Wt 193.0 lb

## 2021-03-27 DIAGNOSIS — I10 Essential (primary) hypertension: Secondary | ICD-10-CM | POA: Diagnosis not present

## 2021-03-27 DIAGNOSIS — E1169 Type 2 diabetes mellitus with other specified complication: Secondary | ICD-10-CM

## 2021-03-27 DIAGNOSIS — Z125 Encounter for screening for malignant neoplasm of prostate: Secondary | ICD-10-CM

## 2021-03-27 DIAGNOSIS — E785 Hyperlipidemia, unspecified: Secondary | ICD-10-CM

## 2021-03-27 LAB — POCT GLYCOSYLATED HEMOGLOBIN (HGB A1C): Hemoglobin A1C: 5.2 % (ref 4.0–5.6)

## 2021-03-27 MED ORDER — XIGDUO XR 10-1000 MG PO TB24: 1.0000 | ORAL_TABLET | Freq: Every day | ORAL | 1 refills | Status: DC

## 2021-03-27 NOTE — Progress Notes (Signed)
Subjective:    Patient ID: Mark Stevens, male    DOB: 1966-09-21, 55 y.o.   MRN: [id number]  HPI Pt is a 55 yo with T2DM, HLD, HTN who presents to the clinic for 6 month follow up.   His weight is up some but he is doing great. He is not checking sugars. He is on xigduo. Denies any CP, palpitations, headaches, vision changes. He has a lot of energy but sleeping well. Taking medication daily.   .. Active Ambulatory Problems    Diagnosis Date Noted   Sleep disturbance 10/16/2013   Snoring 10/16/2013   Bipolar 1 disorder ([place]) 10/16/2013   Obstructive sleep apnea 12/03/2013   Essential hypertension 11/02/2014   Hyperglycemia 01/05/2015   Type 2 diabetes mellitus ([place]) 02/02/2015   Medial epicondylitis of elbow, left 10/09/2016   Pigmented nevus 01/04/2017   Nephrolithiasis 06/05/2017   Tinea versicolor 10/16/2017   Seborrheic keratoses 10/16/2017   Hypertriglyceridemia 03/19/2018   Gastroesophageal reflux disease without esophagitis 03/10/2019   Hyperlipidemia LDL goal <70 03/10/2019   Mixed hyperlipidemia 03/10/2019   Seborrheic dermatitis 03/10/2019   Seasonal allergies 06/29/2019   Sinus drainage 06/29/2019   Cough 06/29/2019   Chronic rhinitis 12/21/2019   Recurrent sinusitis 12/21/2019   Resolved Ambulatory Problems    Diagnosis Date Noted   Poison ivy 05/05/2015   Class 1 obesity due to excess calories with serious comorbidity and body mass index (BMI) of 31.0 to 31.9 in adult 12/06/2017   Past Medical History:  Diagnosis Date   Depression    Hyperlipidemia    Hypertension       Review of Systems  All other systems reviewed and are negative.     Objective:   Physical Exam Vitals reviewed.  Constitutional:      Appearance: Normal appearance.  HENT:     Head: Normocephalic.  Neck:     Vascular: No carotid bruit.  Cardiovascular:     Rate and Rhythm: Normal rate and regular rhythm.     Pulses: Normal pulses.     Heart sounds: Normal heart sounds.  No murmur heard. Pulmonary:     Effort: Pulmonary effort is normal.     Breath sounds: Normal breath sounds.  Musculoskeletal:     Right lower leg: No edema.     Left lower leg: No edema.  Neurological:     General: No focal deficit present.     Mental Status: He is oriented to person, place, and time.  Psychiatric:        Mood and Affect: Mood normal.        Behavior: Behavior normal.   .. Results for orders placed or performed in visit on 03/27/21  POCT glycosylated hemoglobin (Hb A1C)  Result Value Ref Range   Hemoglobin A1C 5.2 4.0 - 5.6 %   HbA1c POC (<> result, manual entry)     HbA1c, POC (prediabetic range)     HbA1c, POC (controlled diabetic range)             Assessment & Plan:  .[name] was seen today for follow-up.  Diagnoses and all orders for this visit:  Essential hypertension  Type 2 diabetes mellitus with other specified complication, without long-term current use of insulin (HCC) -     Dapagliflozin-metFORMIN HCl ER (XIGDUO XR) 07-999 MG TB24; Take 1 tablet by mouth daily. -     COMPLETE METABOLIC PANEL WITH GFR -     CBC with Differential/Platelet -  POCT glycosylated hemoglobin (Hb A1C)  Hyperlipidemia LDL goal <70 -     Lipid Panel w/reflex Direct LDL  Prostate cancer screening -     PSA   A1c to goal and improvement from 6 months ago.  Continue on xigduo.  Continue on ACE. BP to goal.  Labs ordered.  PSA screening.  On statin. Lipid panel ordered.  Covid vaccines x2.  Flu and pneumonia UTD.

## 2021-06-07 ENCOUNTER — Other Ambulatory Visit: Payer: Self-pay | Admitting: Physician Assistant

## 2021-06-07 DIAGNOSIS — E785 Hyperlipidemia, unspecified: Secondary | ICD-10-CM

## 2021-06-20 LAB — COMPLETE METABOLIC PANEL WITH GFR
AG Ratio: 1.9 (calc) (ref 1.0–2.5)
ALT: 13 U/L (ref 9–46)
AST: 14 U/L (ref 10–35)
Albumin: 4.8 g/dL (ref 3.6–5.1)
Alkaline phosphatase (APISO): 61 U/L (ref 35–144)
BUN: 13 mg/dL (ref 7–25)
CO2: 28 mmol/L (ref 20–32)
Calcium: 10.1 mg/dL (ref 8.6–10.3)
Chloride: 101 mmol/L (ref 98–110)
Creat: 1.24 mg/dL (ref 0.70–1.30)
Globulin: 2.5 g/dL (calc) (ref 1.9–3.7)
Glucose, Bld: 104 mg/dL — ABNORMAL HIGH (ref 65–99)
Potassium: 4.8 mmol/L (ref 3.5–5.3)
Sodium: 138 mmol/L (ref 135–146)
Total Bilirubin: 0.7 mg/dL (ref 0.2–1.2)
Total Protein: 7.3 g/dL (ref 6.1–8.1)
eGFR: 69 mL/min/{1.73_m2} (ref >=60)

## 2021-06-20 LAB — CBC WITH DIFFERENTIAL/PLATELET
Absolute Monocytes: 570 cells/uL (ref 200–950)
Basophils Absolute: 42 cells/uL (ref 0–200)
Basophils Relative: 0.7 %
Eosinophils Absolute: 48 cells/uL (ref 15–500)
Eosinophils Relative: 0.8 %
HCT: 50.3 % — ABNORMAL HIGH (ref 38.5–50.0)
Hemoglobin: 17 g/dL (ref 13.2–17.1)
Lymphs Abs: 2052 cells/uL (ref 850–3900)
MCH: 31 pg (ref 27.0–33.0)
MCHC: 33.8 g/dL (ref 32.0–36.0)
MCV: 91.6 fL (ref 80.0–100.0)
MPV: 10.1 fL (ref 7.5–12.5)
Monocytes Relative: 9.5 %
Neutro Abs: 3288 cells/uL (ref 1500–7800)
Neutrophils Relative %: 54.8 %
Platelets: 217 10*3/uL (ref 140–400)
RBC: 5.49 10*6/uL (ref 4.20–5.80)
RDW: 13.2 % (ref 11.0–15.0)
Total Lymphocyte: 34.2 %
WBC: 6 10*3/uL (ref 3.8–10.8)

## 2021-06-20 LAB — LIPID PANEL W/REFLEX DIRECT LDL
Cholesterol: 146 mg/dL (ref <200)
HDL: 71 mg/dL (ref >=40)
LDL Cholesterol (Calc): 58 mg/dL (calc)
Non-HDL Cholesterol (Calc): 75 mg/dL (calc) (ref <130)
Total CHOL/HDL Ratio: 2.1 (calc) (ref <5.0)
Triglycerides: 89 mg/dL (ref <150)

## 2021-06-20 LAB — PSA: PSA: 0.27 ng/mL (ref <=4.00)

## 2021-06-20 NOTE — Progress Notes (Signed)
Renard,   Your HDL, good cholesterol, is amazing! Your LDL, bad cholesterol, looks great too and to goal.  Kidney and liver look fabulous.  PSA nice and low.   Labs look really good!

## 2021-06-29 ENCOUNTER — Other Ambulatory Visit: Payer: Self-pay | Admitting: Physician Assistant

## 2021-06-29 DIAGNOSIS — E785 Hyperlipidemia, unspecified: Secondary | ICD-10-CM

## 2021-07-03 ENCOUNTER — Other Ambulatory Visit: Payer: Self-pay | Admitting: Physician Assistant

## 2021-07-03 DIAGNOSIS — I10 Essential (primary) hypertension: Secondary | ICD-10-CM

## 2021-07-03 DIAGNOSIS — B360 Pityriasis versicolor: Secondary | ICD-10-CM

## 2021-09-26 ENCOUNTER — Other Ambulatory Visit: Payer: Self-pay

## 2021-09-26 ENCOUNTER — Ambulatory Visit: Payer: 59 | Admitting: Physician Assistant

## 2021-09-26 ENCOUNTER — Encounter: Payer: Self-pay | Admitting: Physician Assistant

## 2021-09-26 VITALS — BP 121/73 | HR 79 | Ht 70.0 in | Wt 192.0 lb

## 2021-09-26 DIAGNOSIS — E1169 Type 2 diabetes mellitus with other specified complication: Secondary | ICD-10-CM | POA: Diagnosis not present

## 2021-09-26 DIAGNOSIS — B360 Pityriasis versicolor: Secondary | ICD-10-CM

## 2021-09-26 DIAGNOSIS — I10 Essential (primary) hypertension: Secondary | ICD-10-CM

## 2021-09-26 DIAGNOSIS — E785 Hyperlipidemia, unspecified: Secondary | ICD-10-CM | POA: Diagnosis not present

## 2021-09-26 LAB — POCT GLYCOSYLATED HEMOGLOBIN (HGB A1C): Hemoglobin A1C: 5.4 % (ref 4.0–5.6)

## 2021-09-26 MED ORDER — XIGDUO XR 10-1000 MG PO TB24: 1.0000 | ORAL_TABLET | Freq: Every day | ORAL | 1 refills | Status: DC

## 2021-09-26 MED ORDER — KETOCONAZOLE 2 % EX CREA: 1.0000 "application " | TOPICAL_CREAM | Freq: Two times a day (BID) | CUTANEOUS | 3 refills | Status: DC

## 2021-09-26 MED ORDER — FLUCONAZOLE 150 MG PO TABS: ORAL_TABLET | ORAL | 0 refills | Status: DC

## 2021-09-26 MED ORDER — LISINOPRIL 20 MG PO TABS: ORAL_TABLET | ORAL | 1 refills | Status: DC

## 2021-09-26 NOTE — Patient Instructions (Signed)
Selsun Blue as a body wash.

## 2021-09-26 NOTE — Progress Notes (Signed)
Subjective:    Patient ID: Mark Stevens, male    DOB: 1966/03/07, 55 y.o.   MRN: [id number]  HPI Pt is a 55 yo male with HTN, T2DM, OSA, GERD, Bipolar who presents to the clinic for 6 month follow up.   Pt is doing great. No concerns or complaints. He is not checking his sugars. He denies any hypoglycemic events. No CP, palpitations, headaches or vision changes. He is taking medications and prescribed. He has not been as strict with foods and drink lately but plans on getting back on track.   He continues to have some tinea on back and request refills.   .. Active Ambulatory Problems    Diagnosis Date Noted   Sleep disturbance 10/16/2013   Snoring 10/16/2013   Bipolar 1 disorder ([place]) 10/16/2013   Obstructive sleep apnea 12/03/2013   Essential hypertension 11/02/2014   Hyperglycemia 01/05/2015   Type 2 diabetes mellitus ([place]) 02/02/2015   Medial epicondylitis of elbow, left 10/09/2016   Pigmented nevus 01/04/2017   Nephrolithiasis 06/05/2017   Tinea versicolor 10/16/2017   Seborrheic keratoses 10/16/2017   Hypertriglyceridemia 03/19/2018   Gastroesophageal reflux disease without esophagitis 03/10/2019   Hyperlipidemia LDL goal <70 03/10/2019   Mixed hyperlipidemia 03/10/2019   Seborrheic dermatitis 03/10/2019   Seasonal allergies 06/29/2019   Sinus drainage 06/29/2019   Cough 06/29/2019   Chronic rhinitis 12/21/2019   Recurrent sinusitis 12/21/2019   Resolved Ambulatory Problems    Diagnosis Date Noted   Poison ivy 05/05/2015   Class 1 obesity due to excess calories with serious comorbidity and body mass index (BMI) of 31.0 to 31.9 in adult 12/06/2017   Past Medical History:  Diagnosis Date   Depression    Hyperlipidemia    Hypertension        Review of Systems  All other systems reviewed and are negative.     Objective:   Physical Exam Vitals reviewed.  Constitutional:      Appearance: Normal appearance.  HENT:     Head: Normocephalic.  Neck:      Vascular: No carotid bruit.  Cardiovascular:     Rate and Rhythm: Normal rate and regular rhythm.     Pulses: Normal pulses.     Heart sounds: Normal heart sounds.  Pulmonary:     Effort: Pulmonary effort is normal.     Breath sounds: Normal breath sounds.  Musculoskeletal:     Right lower leg: No edema.     Left lower leg: No edema.  Neurological:     General: No focal deficit present.     Mental Status: He is alert and oriented to person, place, and time.  Psychiatric:        Mood and Affect: Mood normal.   .. Depression screen [facility] 2/9 03/22/2020 09/21/2019 03/06/2019 06/13/2018 12/06/2017  Decreased Interest 0 0 0 0 0  Down, Depressed, Hopeless 0 0 0 1 0  PHQ - 2 Score 0 0 0 1 0  Altered sleeping 0 - 0 0 1  Tired, decreased energy 0 - 0 0 1  Change in appetite 0 - 0 1 0  Feeling bad or failure about yourself  0 - 0 1 1  Trouble concentrating 0 - 0 0 0  Moving slowly or fidgety/restless 0 - 0 0 0  Suicidal thoughts 0 - 0 0 0  PHQ-9 Score 0 - 0 3 3  Difficult doing work/chores Not difficult at all - Not difficult at all Not difficult at all Somewhat  difficult   .[name] GAD 7 : Generalized Anxiety Score 03/22/2020 09/21/2019 03/06/2019 06/13/2018  Nervous, Anxious, on Edge 0 0 0 0  Control/stop worrying 0 0 0 0  Worry too much - different things 0 0 0 0  Trouble relaxing 0 0 0 0  Restless 0 0 0 0  Easily annoyed or irritable 0 0 0 1  Afraid - awful might happen 0 0 0 0  Total GAD 7 Score 0 0 0 1  Anxiety Difficulty Not difficult at all Not difficult at all Not difficult at all Not difficult at all    .[name] Results for orders placed or performed in visit on 09/26/21  POCT glycosylated hemoglobin (Hb A1C)  Result Value Ref Range   Hemoglobin A1C 5.4 4.0 - 5.6 %   HbA1c POC (<> result, manual entry)     HbA1c, POC (prediabetic range)     HbA1c, POC (controlled diabetic range)             Assessment & Plan:   .[name] was seen today for follow-up.  Diagnoses and all orders for  this visit:  Type 2 diabetes mellitus with other specified complication, without long-term current use of insulin (HCC) -     POCT glycosylated hemoglobin (Hb A1C) -     Dapagliflozin-metFORMIN HCl ER (XIGDUO XR) 07-999 MG TB24; Take 1 tablet by mouth daily.  Tinea versicolor -     ketoconazole (NIZORAL) 2 % cream; Apply 1 application topically 2 (two) times daily. To affected areas. -     fluconazole (DIFLUCAN) 150 MG tablet; Two tabs at once taken on a weekly basis for a total of three weeks.  Essential hypertension -     lisinopril (ZESTRIL) 20 MG tablet; TAKE 1 TABLET BY MOUTH DAILY FOR BLOOD PRESSURE CONTROL.   a1C is 5.4 and great. Continue xigduo BP to goal on ACE refilled today On statin Declined foot exam Calling about eye exam Pt declined covid,flu,shingles,pneumonia vaccine. Pt aware of risk Follow up in 6 months  Diflucan and ketoconazole refilled today  PHQ and GAD no concerns today  Follow up in 6 months.

## 2021-12-01 ENCOUNTER — Other Ambulatory Visit: Payer: Self-pay | Admitting: Physician Assistant

## 2021-12-01 DIAGNOSIS — E1169 Type 2 diabetes mellitus with other specified complication: Secondary | ICD-10-CM

## 2021-12-05 ENCOUNTER — Other Ambulatory Visit: Payer: Self-pay | Admitting: Physician Assistant

## 2021-12-05 DIAGNOSIS — E1169 Type 2 diabetes mellitus with other specified complication: Secondary | ICD-10-CM

## 2021-12-06 ENCOUNTER — Telehealth: Payer: Self-pay | Admitting: Physician Assistant

## 2021-12-06 ENCOUNTER — Telehealth: Payer: Self-pay

## 2021-12-06 NOTE — Telephone Encounter (Signed)
Is this something that maybe needs a PA?  ?

## 2021-12-06 NOTE — Telephone Encounter (Addendum)
Initiated Prior authorization IWO:[id number] Xr Tab 07-999 ?Via: Covermymeds ?Case/Key:[id number] ?Status: approved  as of 12/06/21 ?Reason: approved through 12/07/2022 Called pharmacy pt has $25 co-pay called pt to let know unable to reach. ?Notified Pt via: pt does not have Mychart, pt came in office received a copy of the approval letter in hand ?

## 2021-12-06 NOTE — Telephone Encounter (Signed)
Mark Stevens said his insurance does not cover Xigduo. He wants to know if you can provide him with a prescription that is covered by his insurance.  ?

## 2021-12-06 NOTE — Telephone Encounter (Signed)
Let me know key and if another drug like synjardy to rx.

## 2021-12-12 ENCOUNTER — Encounter: Payer: Self-pay | Admitting: Physician Assistant

## 2021-12-12 ENCOUNTER — Telehealth: Payer: 59 | Admitting: Physician Assistant

## 2021-12-12 ENCOUNTER — Other Ambulatory Visit: Payer: Self-pay

## 2021-12-12 VITALS — Ht 70.0 in | Wt 190.0 lb

## 2021-12-12 DIAGNOSIS — J329 Chronic sinusitis, unspecified: Secondary | ICD-10-CM

## 2021-12-12 DIAGNOSIS — J40 Bronchitis, not specified as acute or chronic: Secondary | ICD-10-CM

## 2021-12-12 MED ORDER — AZITHROMYCIN 250 MG PO TABS: ORAL_TABLET | ORAL | 0 refills | Status: DC

## 2021-12-12 NOTE — Progress Notes (Signed)
Several week history ?Sinus congestion ?Drainage  ?Sore throat ?Low energy ?Worse at night - cough ?Occasional headaches ? ? ?Taking tylenol/advil/nyquil ? ? ?

## 2021-12-12 NOTE — Progress Notes (Signed)
..  Virtual Visit via Telephone Note ? ?I connected with Mark Stevens on 12/12/21 at  4:00 PM EDT by telephone and verified that I am speaking with the correct person using two identifiers. ? ?Location: ?Patient: home ?Provider: clinic ? ?[name].Participating in visit:  ?Patient: Mark Stevens ?Provider: [name] PA-C ?Provider in training: Leona Singleton PA-S ?  ?I discussed the limitations, risks, security and privacy concerns of performing an evaluation and management service by telephone and the availability of in person appointments. I also discussed with the patient that there may be a patient responsible charge related to this service. The patient expressed understanding and agreed to proceed. ? ? ?History of Present Illness: ?Pt is a 56 yo male who calls in with cough, congestion, ST, low energy, headaches, chest tightness for several weeks. Has taken dayquil, nyquil, tylenol with some relief. Continues to cough and blow sputum up that is yellow in color. No fever, chills, body aches.  ? ?.. ?Active Ambulatory Problems  ?  Diagnosis Date Noted  ? Sleep disturbance 10/16/2013  ? Snoring 10/16/2013  ? Bipolar 1 disorder ([place]) 10/16/2013  ? Obstructive sleep apnea 12/03/2013  ? Essential hypertension 11/02/2014  ? Hyperglycemia 01/05/2015  ? Type 2 diabetes mellitus ([place]) 02/02/2015  ? Medial epicondylitis of elbow, left 10/09/2016  ? Pigmented nevus 01/04/2017  ? Nephrolithiasis 06/05/2017  ? Tinea versicolor 10/16/2017  ? Seborrheic keratoses 10/16/2017  ? Hypertriglyceridemia 03/19/2018  ? Gastroesophageal reflux disease without esophagitis 03/10/2019  ? Hyperlipidemia LDL goal <70 03/10/2019  ? Mixed hyperlipidemia 03/10/2019  ? Seborrheic dermatitis 03/10/2019  ? Seasonal allergies 06/29/2019  ? Sinus drainage 06/29/2019  ? Cough 06/29/2019  ? Chronic rhinitis 12/21/2019  ? Recurrent sinusitis 12/21/2019  ? ?Resolved Ambulatory Problems  ?  Diagnosis Date Noted  ? Poison ivy 05/05/2015  ? Class 1 obesity due to  excess calories with serious comorbidity and body mass index (BMI) of 31.0 to 31.9 in adult 12/06/2017  ? ?Past Medical History:  ?Diagnosis Date  ? Depression   ? Hyperlipidemia   ? Hypertension   ? ? ?Observations/Objective: ?No acute distress ?Productive cough ?Congested sounding ?Normal mood ? ?.. ?Today's Vitals  ? 12/12/21 1356  ?Weight: 190 lb (86.2 kg)  ?Height: '5\' 10"'$  (1.778 m)  ? ?Body mass index is 27.26 kg/m?. ? ? ? ?Assessment and Plan: ?..Elizandro was seen today for sinus problem. ? ?Diagnoses and all orders for this visit: ? ?Sinobronchitis ?-     azithromycin (ZITHROMAX Z-PAK) 250 MG tablet; Take 2 tablets (500 mg) on  Day 1,  followed by 1 tablet (250 mg) once daily on Days 2 through 5. ? ? ?Treated with zpak ?Continue OTC cough syrup/drops ?Rest and hydrate ?Prednisone caused vision loss consider decadron if need steroid to help treat the bronchitis component ?Consider albuterol if chest tightness and cough not improving  ?Follow up with any new or worsening symptom.  ? ? ?Follow Up Instructions: ? ?  ?I discussed the assessment and treatment plan with the patient. The patient was provided an opportunity to ask questions and all were answered. The patient agreed with the plan and demonstrated an understanding of the instructions. ?  ?The patient was advised to call back or seek an in-person evaluation if the symptoms worsen or if the condition fails to improve as anticipated. ? ?I provided 10 minutes of non-face-to-face time during this encounter. ? ? ?[name], PA-C ? ?

## 2022-01-23 ENCOUNTER — Telehealth: Payer: Self-pay | Admitting: Physician Assistant

## 2022-01-23 NOTE — Telephone Encounter (Signed)
Patient called to ask if it is ok for him to take a weight loss supplement called [email or web address].  ?

## 2022-01-23 NOTE — Telephone Encounter (Signed)
Patient made aware.

## 2022-02-12 ENCOUNTER — Other Ambulatory Visit: Payer: Self-pay | Admitting: Neurology

## 2022-02-12 DIAGNOSIS — I10 Essential (primary) hypertension: Secondary | ICD-10-CM

## 2022-02-12 MED ORDER — LISINOPRIL 20 MG PO TABS
ORAL_TABLET | ORAL | 0 refills | Status: DC
Start: 2022-02-12 — End: 2022-03-20

## 2022-03-16 ENCOUNTER — Telehealth: Payer: Self-pay | Admitting: Physician Assistant

## 2022-03-16 DIAGNOSIS — E1169 Type 2 diabetes mellitus with other specified complication: Secondary | ICD-10-CM

## 2022-03-16 MED ORDER — XIGDUO XR 10-1000 MG PO TB24: 1.0000 | ORAL_TABLET | Freq: Every day | ORAL | 0 refills | Status: DC

## 2022-03-16 NOTE — Telephone Encounter (Signed)
Looks like you have refill but sent.

## 2022-03-16 NOTE — Telephone Encounter (Signed)
Patient called and stated he is out of medication below & needs a refill sent to pharmacy listed below.   XIGDUO XR 07-999 MG TB24   [id number] [place] [address]:  [phone] Fax:  [phone]

## 2022-03-16 NOTE — Telephone Encounter (Signed)
Edit to Add: Patient has a 6 month follow up scheduled on 03/29/22.

## 2022-03-20 ENCOUNTER — Other Ambulatory Visit: Payer: Self-pay | Admitting: Physician Assistant

## 2022-03-20 DIAGNOSIS — E785 Hyperlipidemia, unspecified: Secondary | ICD-10-CM

## 2022-03-20 DIAGNOSIS — E1169 Type 2 diabetes mellitus with other specified complication: Secondary | ICD-10-CM

## 2022-03-20 DIAGNOSIS — I10 Essential (primary) hypertension: Secondary | ICD-10-CM

## 2022-03-27 ENCOUNTER — Ambulatory Visit: Payer: 59 | Admitting: Physician Assistant

## 2022-03-29 ENCOUNTER — Ambulatory Visit: Payer: 59 | Admitting: Physician Assistant

## 2022-03-29 ENCOUNTER — Other Ambulatory Visit: Payer: Self-pay | Admitting: Physician Assistant

## 2022-03-29 ENCOUNTER — Encounter: Payer: Self-pay | Admitting: Physician Assistant

## 2022-03-29 VITALS — BP 109/73 | HR 63 | Ht 70.0 in | Wt 196.0 lb

## 2022-03-29 DIAGNOSIS — E785 Hyperlipidemia, unspecified: Secondary | ICD-10-CM | POA: Diagnosis not present

## 2022-03-29 DIAGNOSIS — I10 Essential (primary) hypertension: Secondary | ICD-10-CM

## 2022-03-29 DIAGNOSIS — F319 Bipolar disorder, unspecified: Secondary | ICD-10-CM | POA: Diagnosis not present

## 2022-03-29 DIAGNOSIS — E1169 Type 2 diabetes mellitus with other specified complication: Secondary | ICD-10-CM

## 2022-03-29 LAB — POCT GLYCOSYLATED HEMOGLOBIN (HGB A1C): Hemoglobin A1C: 5.8 % — AB (ref 4.0–5.6)

## 2022-03-29 MED ORDER — BUPROPION HCL ER (XL) 300 MG PO TB24: 300.0000 mg | ORAL_TABLET | Freq: Every day | ORAL | 1 refills | Status: DC

## 2022-03-29 MED ORDER — LISINOPRIL 20 MG PO TABS: ORAL_TABLET | ORAL | 1 refills | Status: DC

## 2022-03-29 MED ORDER — DIVALPROEX SODIUM ER 500 MG PO TB24: 500.0000 mg | ORAL_TABLET | Freq: Two times a day (BID) | ORAL | 1 refills | Status: DC

## 2022-03-29 MED ORDER — ATORVASTATIN CALCIUM 10 MG PO TABS: 10.0000 mg | ORAL_TABLET | Freq: Every day | ORAL | 1 refills | Status: DC

## 2022-03-29 NOTE — Progress Notes (Signed)
Established Patient Office Visit  Subjective   Patient ID: Mark Stevens, male    DOB: 02-28-1966  Age: 56 y.o. MRN: [id number]  No chief complaint on file.   HPI Pt is a 56 yo male with T2DM, HTN, GERD, OSA, HLD, bipolar 1 who presents to the clinic for 6 month follow up.   Pt is doing well. He is not checking his sugars regularly. He is compliant with medications. He tries to limit sugars and carbs and leads a very active lifestyle. Denies any CP, palpitations, headaches or vision changes.   He continues to use the CPAP nightly and doing well.   His mood is doing well and he has no concerns.   .. Active Ambulatory Problems    Diagnosis Date Noted   Sleep disturbance 10/16/2013   Snoring 10/16/2013   Bipolar 1 disorder ([place]) 10/16/2013   Obstructive sleep apnea 12/03/2013   Essential hypertension 11/02/2014   Hyperglycemia 01/05/2015   Type 2 diabetes mellitus ([place]) 02/02/2015   Medial epicondylitis of elbow, left 10/09/2016   Pigmented nevus 01/04/2017   Nephrolithiasis 06/05/2017   Tinea versicolor 10/16/2017   Seborrheic keratoses 10/16/2017   Hypertriglyceridemia 03/19/2018   Gastroesophageal reflux disease without esophagitis 03/10/2019   Hyperlipidemia LDL goal <70 03/10/2019   Mixed hyperlipidemia 03/10/2019   Seborrheic dermatitis 03/10/2019   Seasonal allergies 06/29/2019   Sinus drainage 06/29/2019   Cough 06/29/2019   Chronic rhinitis 12/21/2019   Recurrent sinusitis 12/21/2019   Resolved Ambulatory Problems    Diagnosis Date Noted   Poison ivy 05/05/2015   Class 1 obesity due to excess calories with serious comorbidity and body mass index (BMI) of 31.0 to 31.9 in adult 12/06/2017   Past Medical History:  Diagnosis Date   Depression    Hyperlipidemia    Hypertension      Review of Systems  All other systems reviewed and are negative.     Objective:     BP 109/73   Pulse 63   Ht '5\' 10"'$  (1.778 m)   Wt 196 lb (88.9 kg)   SpO2 99%   BMI  28.12 kg/m  BP Readings from Last 3 Encounters:  03/29/22 109/73  09/26/21 121/73  03/27/21 106/66   Wt Readings from Last 3 Encounters:  03/29/22 196 lb (88.9 kg)  12/12/21 190 lb (86.2 kg)  09/26/21 192 lb (87.1 kg)      Physical Exam Constitutional:      Appearance: Normal appearance.  HENT:     Head: Normocephalic.  Neck:     Vascular: No carotid bruit.  Cardiovascular:     Rate and Rhythm: Normal rate and regular rhythm.     Pulses: Normal pulses.  Pulmonary:     Effort: Pulmonary effort is normal.     Breath sounds: Normal breath sounds.  Musculoskeletal:     Right lower leg: No edema.     Left lower leg: No edema.  Lymphadenopathy:     Cervical: No cervical adenopathy.  Neurological:     General: No focal deficit present.     Mental Status: He is alert and oriented to person, place, and time.     Motor: No weakness.     Comments: Bilateral pedal pulses intact.   Psychiatric:        Mood and Affect: Mood normal.      Results for orders placed or performed in visit on 03/29/22  POCT HgB A1C  Result Value Ref Range   Hemoglobin A1C  5.8 (A) 4.0 - 5.6 %   HbA1c POC (<> result, manual entry)     HbA1c, POC (prediabetic range)     HbA1c, POC (controlled diabetic range)          Assessment & Plan:  Marland KitchenMarland KitchenDiagnoses and all orders for this visit:  Type 2 diabetes mellitus with other specified complication, without long-term current use of insulin (HCC) -     POCT HgB A1C  Essential hypertension -     COMPLETE METABOLIC PANEL WITH GFR -     lisinopril (ZESTRIL) 20 MG tablet; TAKE 1 TABLET BY MOUTH EVERY DAY FOR BLOOD PRESSURE CONTROL  Hyperlipidemia LDL goal <70 -     atorvastatin (LIPITOR) 10 MG tablet; Take 1 tablet (10 mg total) by mouth daily.  Bipolar 1 disorder (HCC) -     buPROPion (WELLBUTRIN XL) 300 MG 24 hr tablet; Take 1 tablet (300 mg total) by mouth daily. -     divalproex (DEPAKOTE ER) 500 MG 24 hr tablet; Take 1 tablet (500 mg total) by  mouth 2 (two) times daily.   A1C is to goal Continue same medications, sent refill of xigduo Discussed nutrition a little bit further and printed some information.  BP to goal on ACE. On statin.  Needs eye exam.  Foot exam UTD.  Covid vaccine x2, declines booster Pneumonia/flu/shingles vaccine declined Follow up in 6 months  Mood doing great with no concerns Sent refills.   Return in about 6 months (around 09/28/2022).    [name], PA-C

## 2022-03-29 NOTE — Patient Instructions (Signed)

## 2022-03-30 NOTE — Telephone Encounter (Signed)
LVM for patient to call back to confirm medication and dosage/directions.

## 2022-03-30 NOTE — Telephone Encounter (Signed)
See note from Pharmacy, please advise.

## 2022-04-02 NOTE — Telephone Encounter (Signed)
RX changed 

## 2022-04-20 ENCOUNTER — Emergency Department
Admission: EM | Admit: 2022-04-20 | Discharge: 2022-04-20 | Disposition: A | Discharge status: Home / Self Care | Payer: 59 | Attending: Nurse Practitioner | Admitting: Nurse Practitioner

## 2022-04-20 ENCOUNTER — Encounter: Payer: Self-pay | Admitting: Emergency Medicine

## 2022-04-20 DIAGNOSIS — L237 Allergic contact dermatitis due to plants, except food: Secondary | ICD-10-CM | POA: Diagnosis not present

## 2022-04-20 MED ORDER — TRIAMCINOLONE ACETONIDE 0.1 % EX OINT
1.0000 | TOPICAL_OINTMENT | Freq: Two times a day (BID) | CUTANEOUS | 0 refills | Status: DC
Start: 2022-04-20 — End: 2023-02-27

## 2022-04-20 NOTE — ED Provider Notes (Signed)
[name]    CSN: [id number] Arrival date & time: 04/20/22  4585      History   Chief Complaint Chief Complaint  Patient presents with   Rash    HPI Mark Stevens is a 56 y.o. male.   Patient presents with rash to bilateral upper extremities that has been present for the past 5 days and has recently spread to trunk.  Reports last weekend, he was helping his son trim some bushes and thinks he may have been in some poison ivy.  He was away on a work trip for the past few days and noticed the rash started after he left for the trip.  Reports the rash is red, oozing, very itchy.  Denies any pain, white/thick drainage, fevers, nausea/vomiting.  He has used over-the-counter hydrocortisone cream mild relief.  Patient reports medical history significant for type 2 diabetes, obstructive sleep apnea, hypertension, bipolar.  Reports recent A1c was 5.8%.  Also reports allergy to prednisone-makes him have blurry vision.    Past Medical History:  Diagnosis Date   Bipolar 1 disorder ([place])    Depression    Hyperlipidemia    Hypertension     Patient Active Problem List   Diagnosis Date Noted   Chronic rhinitis 12/21/2019   Recurrent sinusitis 12/21/2019   Seasonal allergies 06/29/2019   Sinus drainage 06/29/2019   Cough 06/29/2019   Gastroesophageal reflux disease without esophagitis 03/10/2019   Hyperlipidemia LDL goal <70 03/10/2019   Mixed hyperlipidemia 03/10/2019   Seborrheic dermatitis 03/10/2019   Hypertriglyceridemia 03/19/2018   Tinea versicolor 10/16/2017   Seborrheic keratoses 10/16/2017   Nephrolithiasis 06/05/2017   Pigmented nevus 01/04/2017   Medial epicondylitis of elbow, left 10/09/2016   Type 2 diabetes mellitus ([place]) 02/02/2015   Hyperglycemia 01/05/2015   Essential hypertension 11/02/2014   Obstructive sleep apnea 12/03/2013   Sleep disturbance 10/16/2013   Snoring 10/16/2013   Bipolar 1 disorder ([place]) 10/16/2013    History reviewed. No  pertinent surgical history.     Home Medications    Prior to Admission medications   Medication Sig Start Date End Date Taking? Authorizing Provider  AMBULATORY NON FORMULARY MEDICATION CPAP supplies (mask/strap/tubing) DX: G47.33 12/23/19  Yes Breeback, [name] L, PA-C  atorvastatin (LIPITOR) 10 MG tablet Take 1 tablet (10 mg total) by mouth daily. 05/29/22  Yes Breeback, [name] L, PA-C  blood glucose meter kit and supplies KIT Dispense based on patient and insurance preference. Use up to four times daily as directed. (FOR ICD-9 250.00, 250.01). 12/21/19  Yes Breeback, [name] L, PA-C  buPROPion (WELLBUTRIN XL) 300 MG 24 hr tablet Take 1 tablet (300 mg total) by mouth daily. 03/29/22  Yes Breeback, [name] L, PA-C  Dapagliflozin-metFORMIN HCl ER (XIGDUO XR) 07-999 MG TB24 TAKE 1 TABLET BY MOUTH EVERY DAY 03/20/22  Yes Breeback, [name] L, PA-C  divalproex (DEPAKOTE) 500 MG DR tablet Take 1 tablet (500 mg total) by mouth 2 (two) times daily. 04/02/22  Yes Breeback, [name] L, PA-C  ketoconazole (NIZORAL) 2 % cream Apply 1 application topically 2 (two) times daily. To affected areas. 09/26/21  Yes Breeback, [name] L, PA-C  lisinopril (ZESTRIL) 20 MG tablet TAKE 1 TABLET BY MOUTH EVERY DAY FOR BLOOD PRESSURE CONTROL 05/29/22  Yes Breeback, [name] L, PA-C  triamcinolone ointment (KENALOG) 0.1 % Apply 1 Application topically 2 (two) times daily. Applying sparingly to affected areas; do not use for more than 2 weeks in a row 04/20/22  Yes Eulogio Bear, NP  Family History Family History  Problem Relation Age of Onset   Atrial fibrillation Father    Prostate cancer Other        uncle   Congestive Heart Failure Other     Social History Social History   Tobacco Use   Smoking status: Never   Smokeless tobacco: Never  Vaping Use   Vaping Use: Never used  Substance Use Topics   Alcohol use: No   Drug use: No     Allergies   Epinephrine and Prednisone   Review of Systems Review of Systems Per  HPI  Physical Exam Triage Vital Signs ED Triage Vitals  Enc Vitals Group     BP 04/20/22 0919 97/65     Pulse Rate 04/20/22 0919 77     Resp 04/20/22 0919 18     Temp 04/20/22 0919 98.6 F (37 C)     Temp Source 04/20/22 0919 Oral     SpO2 04/20/22 0919 95 %     Weight 04/20/22 0922 198 lb (89.8 kg)     Height 04/20/22 0922 _0  (1.778 m)     Head Circumference --      Peak Flow --      Pain Score 04/20/22 0921 2     Pain Loc --      Pain Edu? --      Excl. in [place]? --    No data found.  Updated Vital Signs BP 97/65 (BP Location: Right Arm)   Pulse 77   Temp 98.6 F (37 C) (Oral)   Resp 18   Ht _1  (1.778 m)   Wt 198 lb (89.8 kg)   SpO2 95%   BMI 28.41 kg/m   Visual Acuity Right Eye Distance:   Left Eye Distance:   Bilateral Distance:    Right Eye Near:   Left Eye Near:    Bilateral Near:     Physical Exam Vitals and nursing note reviewed.  Constitutional:      General: He is not in acute distress.    Appearance: Normal appearance. He is not toxic-appearing.  HENT:     Head: Normocephalic and atraumatic.     Mouth/Throat:     Mouth: Mucous membranes are moist.     Pharynx: Oropharynx is clear.  Pulmonary:     Effort: Pulmonary effort is normal. No respiratory distress.  Skin:    General: Skin is warm and dry.     Capillary Refill: Capillary refill takes less than 2 seconds.     Coloration: Skin is not jaundiced or pale.     Findings: Erythema and rash present. Rash is crusting and vesicular.          Comments: Widespread rash to area marked; rash is erythematous, clumps of vesicular lesions in some areas.  Some areas oozing serous drainage.    Neurological:     Mental Status: He is alert and oriented to person, place, and time.  Psychiatric:        Behavior: Behavior is cooperative.      UC Treatments / Results  Labs (all labs ordered are listed, but only abnormal results are displayed) Labs Reviewed - No data to  display  EKG   Radiology No results found.  Procedures Procedures (including critical care time)  Medications Ordered in UC Medications - No data to display  Initial Impression / Assessment and Plan / UC Course  I have reviewed the triage vital signs and the nursing notes.  Pertinent  labs & imaging results that were available during my care of the patient were reviewed by me and considered in my medical decision making (see chart for details).    Patient is a very pleasant, well-appearing 56 year old male presenting for rash.  Highly suspicious for plant dermatitis given vesicular, oozing nature.  Given allergy to prednisone, will start high potency topical corticosteroid applied twice daily.  Also start oral antihistamine and discussed other supportive care.  ER precautions discussed.  Seek care if symptoms persist or worsen despite treatment.  The patient was given the opportunity to ask questions.  All questions answered to their satisfaction.  The patient is in agreement to this plan.   Final Clinical Impressions(s) / UC Diagnoses   Final diagnoses:  Plant allergic contact dermatitis     Discharge Instructions      - Start using the Kenalog ointment twice daily sparingly to the affected areas -You can take an oral antihistamine like cetirizine, fexofenadine, loratadine during the day to help with itching and diphenhydramine at nighttime to help with itch -Oatmeal baths, or dilute bleach baths can help to dry up the oozing areas    ED Prescriptions     Medication Sig Dispense Auth. Provider   triamcinolone ointment (KENALOG) 0.1 % Apply 1 Application topically 2 (two) times daily. Applying sparingly to affected areas; do not use for more than 2 weeks in a row 30 g Eulogio Bear, NP      PDMP not reviewed this encounter.   Eulogio Bear, NP 04/20/22 [phone]

## 2022-04-20 NOTE — ED Triage Notes (Signed)
Patient c/o rash possible poison oak or ivy on both arms and torso x 6 days.  The area does itch at times, some drainage from the bumps.  Has applied hydrocortisone cream to the areas.

## 2022-04-20 NOTE — Discharge Instructions (Addendum)
-   Start using the Kenalog ointment twice daily sparingly to the affected areas -You can take an oral antihistamine like cetirizine, fexofenadine, loratadine during the day to help with itching and diphenhydramine at nighttime to help with itch -Oatmeal baths, or dilute bleach baths can help to dry up the oozing areas

## 2022-07-03 ENCOUNTER — Encounter: Payer: Self-pay | Admitting: Physician Assistant

## 2022-09-02 ENCOUNTER — Other Ambulatory Visit: Payer: Self-pay | Admitting: Physician Assistant

## 2022-09-02 DIAGNOSIS — E1169 Type 2 diabetes mellitus with other specified complication: Secondary | ICD-10-CM

## 2022-09-27 ENCOUNTER — Ambulatory Visit: Payer: 59 | Admitting: Physician Assistant

## 2022-09-28 ENCOUNTER — Ambulatory Visit: Payer: 59 | Admitting: Physician Assistant

## 2022-10-09 ENCOUNTER — Ambulatory Visit: Payer: 59 | Admitting: Physician Assistant

## 2022-10-09 ENCOUNTER — Encounter: Payer: Self-pay | Admitting: Physician Assistant

## 2022-10-09 VITALS — BP 116/78 | HR 85 | Ht 70.0 in | Wt 203.0 lb

## 2022-10-09 DIAGNOSIS — H6992 Unspecified Eustachian tube disorder, left ear: Secondary | ICD-10-CM

## 2022-10-09 DIAGNOSIS — Z1329 Encounter for screening for other suspected endocrine disorder: Secondary | ICD-10-CM

## 2022-10-09 DIAGNOSIS — Z125 Encounter for screening for malignant neoplasm of prostate: Secondary | ICD-10-CM

## 2022-10-09 DIAGNOSIS — I10 Essential (primary) hypertension: Secondary | ICD-10-CM

## 2022-10-09 DIAGNOSIS — Z1211 Encounter for screening for malignant neoplasm of colon: Secondary | ICD-10-CM

## 2022-10-09 DIAGNOSIS — E1169 Type 2 diabetes mellitus with other specified complication: Secondary | ICD-10-CM

## 2022-10-09 DIAGNOSIS — E785 Hyperlipidemia, unspecified: Secondary | ICD-10-CM

## 2022-10-09 DIAGNOSIS — H9202 Otalgia, left ear: Secondary | ICD-10-CM

## 2022-10-09 DIAGNOSIS — F319 Bipolar disorder, unspecified: Secondary | ICD-10-CM

## 2022-10-09 DIAGNOSIS — Z79899 Other long term (current) drug therapy: Secondary | ICD-10-CM | POA: Diagnosis not present

## 2022-10-09 LAB — POCT UA - MICROALBUMIN
Albumin/Creatinine Ratio, Urine, POC: <30
Creatinine, POC: 300 mg/dL
Microalbumin Ur, POC: 30 mg/L

## 2022-10-09 LAB — POCT GLYCOSYLATED HEMOGLOBIN (HGB A1C): Hemoglobin A1C: 6.5 % — AB (ref 4.0–5.6)

## 2022-10-09 MED ORDER — LISINOPRIL 20 MG PO TABS: ORAL_TABLET | ORAL | 1 refills | Status: DC

## 2022-10-09 MED ORDER — DIVALPROEX SODIUM 500 MG PO DR TAB: 500.0000 mg | DELAYED_RELEASE_TABLET | Freq: Two times a day (BID) | ORAL | 0 refills | Status: DC

## 2022-10-09 MED ORDER — RYBELSUS 3 MG PO TABS: 3.0000 mg | ORAL_TABLET | Freq: Every day | ORAL | 0 refills | Status: DC

## 2022-10-09 MED ORDER — BUPROPION HCL ER (XL) 300 MG PO TB24: 300.0000 mg | ORAL_TABLET | Freq: Every day | ORAL | 1 refills | Status: DC

## 2022-10-09 MED ORDER — RYBELSUS 7 MG PO TABS: 7.0000 mg | ORAL_TABLET | Freq: Every day | ORAL | 1 refills | Status: DC

## 2022-10-09 NOTE — Progress Notes (Signed)
Established Patient Office Visit  Subjective   Patient ID: Mark Stevens, male    DOB: 1966/08/03  Age: 57 y.o. MRN: [id number]  Chief Complaint  Patient presents with   Follow-up   Diabetes    HPI Pt is a 57 yo overweight male with T2DM, HTN, OsA, HLD, GERD who presents to the clinic for 6 month follow up and medication refills.   Pt is not checking his sugars. He is taking xigduo daily. He has not been exercising and has been eating a little more candy and sweets through the holiday. No open sores or wounds. No CP, palpitations, headaches or vision changes. No hypoglycemic events.   Using CPAP.   GERD controlled.   He is having some intermittent pain of left ear. No ear discharge. No URI symptoms.    .. Active Ambulatory Problems    Diagnosis Date Noted   Sleep disturbance 10/16/2013   Snoring 10/16/2013   Bipolar 1 disorder ([place]) 10/16/2013   Obstructive sleep apnea 12/03/2013   Essential hypertension 11/02/2014   Hyperglycemia 01/05/2015   Type 2 diabetes mellitus ([place]) 02/02/2015   Medial epicondylitis of elbow, left 10/09/2016   Pigmented nevus 01/04/2017   Nephrolithiasis 06/05/2017   Tinea versicolor 10/16/2017   Seborrheic keratoses 10/16/2017   Hypertriglyceridemia 03/19/2018   Gastroesophageal reflux disease without esophagitis 03/10/2019   Hyperlipidemia LDL goal <70 03/10/2019   Mixed hyperlipidemia 03/10/2019   Seborrheic dermatitis 03/10/2019   Seasonal allergies 06/29/2019   Sinus drainage 06/29/2019   Cough 06/29/2019   Chronic rhinitis 12/21/2019   Recurrent sinusitis 12/21/2019   Resolved Ambulatory Problems    Diagnosis Date Noted   Poison ivy 05/05/2015   Class 1 obesity due to excess calories with serious comorbidity and body mass index (BMI) of 31.0 to 31.9 in adult 12/06/2017   Past Medical History:  Diagnosis Date   Depression    Hyperlipidemia    Hypertension      Review of Systems  All other systems reviewed and are  negative.     Objective:     BP 116/78   Pulse 85   Ht '5\' 10"'$  (1.778 m)   Wt 203 lb (92.1 kg)   SpO2 98%   BMI 29.13 kg/m  BP Readings from Last 3 Encounters:  10/09/22 116/78  04/20/22 97/65  03/29/22 109/73   Wt Readings from Last 3 Encounters:  10/09/22 203 lb (92.1 kg)  04/20/22 198 lb (89.8 kg)  03/29/22 196 lb (88.9 kg)    . Results for orders placed or performed in visit on 10/09/22  POCT glycosylated hemoglobin (Hb A1C)  Result Value Ref Range   Hemoglobin A1C 6.5 (A) 4.0 - 5.6 %   HbA1c POC (<> result, manual entry)     HbA1c, POC (prediabetic range)     HbA1c, POC (controlled diabetic range)    POCT UA - Microalbumin  Result Value Ref Range   Microalbumin Ur, POC 30 mg/L   Creatinine, POC 300 mg/dL   Albumin/Creatinine Ratio, Urine, POC <30      Physical Exam Constitutional:      Appearance: Normal appearance.  HENT:     Head: Normocephalic.     Right Ear: Tympanic membrane, ear canal and external ear normal. There is no impacted cerumen.     Left Ear: Tympanic membrane and external ear normal. There is no impacted cerumen.     Ears:     Comments: Scant erythema and harden wax Cardiovascular:  Rate and Rhythm: Normal rate and regular rhythm.  Pulmonary:     Effort: Pulmonary effort is normal.     Breath sounds: Normal breath sounds.  Musculoskeletal:     Cervical back: No rigidity or tenderness.  Lymphadenopathy:     Cervical: No cervical adenopathy.  Neurological:     General: No focal deficit present.     Mental Status: He is alert and oriented to person, place, and time.  Psychiatric:        Mood and Affect: Mood normal.      The 10-year ASCVD risk score (Arnett DK, et al., 2019) is: 6%    Assessment & Plan:  .[name] was seen today for follow-up and diabetes.  Diagnoses and all orders for this visit:  Type 2 diabetes mellitus with other specified complication, without long-term current use of insulin (HCC) -     POCT  glycosylated hemoglobin (Hb A1C) -     COMPLETE METABOLIC PANEL WITH GFR -     POCT UA - Microalbumin -     Semaglutide (RYBELSUS) 3 MG TABS; Take 3 mg by mouth daily. -     Semaglutide (RYBELSUS) 7 MG TABS; Take 7 mg by mouth daily.  Hyperlipidemia LDL goal <70 -     Lipid Panel w/reflex Direct LDL  Prostate cancer screening -     PSA  Medication management -     POCT glycosylated hemoglobin (Hb A1C) -     PSA -     TSH -     Lipid Panel w/reflex Direct LDL -     COMPLETE METABOLIC PANEL WITH GFR -     CBC with Differential/Platelet  Thyroid disorder screen -     TSH  Colon cancer screening -     Cologuard  Left ear pain  Bipolar 1 disorder (HCC) -     divalproex (DEPAKOTE) 500 MG DR tablet; Take 1 tablet (500 mg total) by mouth 2 (two) times daily. -     buPROPion (WELLBUTRIN XL) 300 MG 24 hr tablet; Take 1 tablet (300 mg total) by mouth daily.  Essential hypertension -     lisinopril (ZESTRIL) 20 MG tablet; TAKE 1 TABLET BY MOUTH EVERY DAY FOR BLOOD PRESSURE CONTROL  Acute dysfunction of left eustachian tube   A1C to goal but made a big jump up Added ryblesus to xigduo Discussed side effects and how to take Discussed DM diet On statin BP to goal, refilled lisinopril Eye and foot exam UTD Negative for any protein in urine Declined all vaccines Follow up in 3 months  Cologuard ordered.  Left ear essentially normal. Consider ETD. Trial of flonase, sudafed, afrin as needed.  Follow up if symptoms change or worsen     Return in about 3 months (around 01/08/2023).    [name], PA-C

## 2022-10-09 NOTE — Patient Instructions (Signed)
Semaglutide Tablets What is this medication? SEMAGLUTIDE (SEM a GLOO tide) treats type 2 diabetes. It works by increasing insulin levels in your body, which decreases your blood sugar (glucose). It also reduces the amount of sugar released into the blood and slows down your digestion. Changes to diet and exercise are often combined with this medication. This medicine may be used for other purposes; ask your health care provider or pharmacist if you have questions. COMMON BRAND NAME(S): Rybelsus What should I tell my care team before I take this medication? They need to know if you have any of these conditions: Endocrine tumors (MEN 2) or if someone in your family had these tumors Eye disease History of pancreatitis Kidney disease Stomach or intestine problems Thyroid cancer or if someone in your family had thyroid cancer Vision problems An unusual or allergic reaction to semaglutide, other medications, foods, dyes, or preservatives Pregnant or trying to get pregnant Breast-feeding How should I use this medication? Take this medication by mouth. Take it as directed on the prescription label at the same time every day. Take the dose right after waking up. Do not eat or drink anything before taking it. Do not take it with any other drink except a glass of plain water that is less than 4 ounces (less than 120 mL). Do not cut, crush or chew this medication. Swallow the tablets whole. After taking it, do not eat breakfast, drink, or take any other medications or vitamins for at least 30 minutes. Keep taking it unless your care team tells you to stop. A special MedGuide will be given to you by the pharmacist with each prescription and refill. Be sure to read this information carefully each time. Talk to your care team about the use of this medication in children. Special care may be needed. Overdosage: If you think you have taken too much of this medicine contact a poison control center or emergency  room at once. NOTE: This medicine is only for you. Do not share this medicine with others. What if I miss a dose? If you miss a dose, skip it. Take your next dose at the normal time. Do not take extra or 2 doses at the same time to make up for the missed dose. What may interact with this medication? What may interact with this medication? Aminophylline Carbamazepine Cyclosporine Digoxin Levothyroxine Other medications for diabetes Phenytoin Tacrolimus Theophylline Warfarin Many medications may cause changes in blood sugar, these include: Alcohol containing beverages Antiviral medications for HIV or AIDS Aspirin and aspirin-like medications Certain medications for blood pressure, heart disease, irregular heart beat Chromium Diuretics Male hormones, such as estrogens or progestins, birth control pills Fenofibrate Gemfibrozil Isoniazid Lanreotide Male hormones or anabolic steroids MAOIs like Carbex, Eldepryl, Marplan, Nardil, and Parnate Medications for weight loss Medications for allergies, asthma, cold, or cough Medications for depression, anxiety, or psychotic disturbances Niacin Nicotine NSAIDs, medications for pain and inflammation, like ibuprofen or naproxen Octreotide Pasireotide Pentamidine Phenytoin Probenecid Quinolone antibiotics such as ciprofloxacin, levofloxacin, ofloxacin Some herbal dietary supplements Steroid medications such as prednisone or cortisone Sulfamethoxazole; trimethoprim Thyroid hormones Some medications can hide the warning symptoms of low blood sugar (hypoglycemia). You may need to monitor your blood sugar more closely if you are taking one of these medications. These include: Beta-blockers, often used for high blood pressure or heart problems (examples include atenolol, metoprolol, propranolol) Clonidine Guanethidine Reserpine This list may not describe all possible interactions. Give your health care provider a list of all the  medicines, herbs, non-prescription drugs, or dietary supplements you use. Also tell them if you smoke, drink alcohol, or use illegal drugs. Some items may interact with your medicine. What should I watch for while using this medication? Visit your care team for regular checks on your progress. Check with your care team if you have severe diarrhea, nausea, and vomiting, or if you sweat a lot. The loss of too much body fluid may make it dangerous for you to take this medication. A test called the HbA1C (A1C) will be monitored. This is a simple blood test. It measures your blood sugar control over the last 2 to 3 months. You will receive this test every 3 to 6 months. Learn how to check your blood sugar. Learn the symptoms of low and high blood sugar and how to manage them. Always carry a quick-source of sugar with you in case you have symptoms of low blood sugar. Examples include hard sugar candy or glucose tablets. Make sure others know that you can choke if you eat or drink when you develop serious symptoms of low blood sugar, such as seizures or unconsciousness. Get medical help at once. Tell your care team if you have high blood sugar. You might need to change the dose of your medication. If you are sick or exercising more than usual, you might need to change the dose of your medication. Do not skip meals. Ask your care team if you should avoid alcohol. Many nonprescription cough and cold products contain sugar or alcohol. These can affect blood sugar. Wear a medical ID bracelet or chain. Carry a card that describes your condition. List the medications and doses you take on the card. Do not become pregnant while taking this medication. Women should inform their care team if they wish to become pregnant or think they might be pregnant. There is a potential for serious side effects to an unborn child. Talk to your care team for more information. Do not breast-feed an infant while taking this  medication. What side effects may I notice from receiving this medication? Side effects that you should report to your care team as soon as possible: Allergic reactions--skin rash, itching, hives, swelling of the face, lips, tongue, or throat Change in vision Dehydration--increased thirst, dry mouth, feeling faint or lightheaded, headache, dark yellow or brown urine Gallbladder problems--severe stomach pain, nausea, vomiting, fever Heart palpitations--rapid, pounding, or irregular heartbeat Kidney injury--decrease in the amount of urine, swelling of the ankles, hands, or feet Pancreatitis--severe stomach pain that spreads to your back or gets worse after eating or when touched, fever, nausea, vomiting Thyroid cancer--new mass or lump in the neck, pain or trouble swallowing, trouble breathing, hoarseness Side effects that usually do not require medical attention (report to your care team if they continue or are bothersome): Diarrhea Loss of appetite Nausea Stomach pain Vomiting This list may not describe all possible side effects. Call your doctor for medical advice about side effects. You may report side effects to FDA at 1-800-FDA-1088. Where should I keep my medication? Keep out of the reach of children and pets. Store at room temperature between 20 and 25 degrees C (68 and 77 degrees F). Keep this medication in the original container. Protect from moisture. Keep the container tightly closed. Get rid of any unused medication after the expiration date. To get rid of medications that are no longer needed or have expired: Take the medication to a medication take-back program. Check with your pharmacy or law enforcement to  find a location. If you cannot return the medication, check the label or package insert to see if the medication should be thrown out in the garbage or flushed down the toilet. If you are not sure, ask your care team. If it is safe to put it in the trash, take the medication  out of the container. Mix the medication with cat litter, dirt, coffee grounds, or other unwanted substance. Seal the mixture in a bag or container. Put it in the trash. NOTE: This sheet is a summary. It may not cover all possible information. If you have questions about this medicine, talk to your doctor, pharmacist, or health care provider.  2023 Elsevier/Gold Standard (2020-12-01 00:00:00)

## 2022-10-15 ENCOUNTER — Telehealth: Payer: Self-pay | Admitting: Physician Assistant

## 2022-10-15 MED ORDER — ONDANSETRON 4 MG PO TBDP: 8.0000 mg | ORAL_TABLET | Freq: Three times a day (TID) | ORAL | 0 refills | Status: DC | PRN

## 2022-10-15 NOTE — Addendum Note (Signed)
Addended by: Narda Rutherford on: 10/15/2022 03:31 PM   Modules accepted: Orders

## 2022-10-15 NOTE — Telephone Encounter (Signed)
Can you see if you can answer any of these drug questions?

## 2022-10-15 NOTE — Telephone Encounter (Signed)
Patient called has questions about new medications he would like a call back please Thank you

## 2022-10-15 NOTE — Telephone Encounter (Signed)
Pt states that he's been taking 3 tablet of Rybelsus 3 MG TABS  since 10/10/21,Last dose was this morning, pt complains of vomiting nausea and loss of appetite and dizziness . Provider informed ,Pt was advised to stop taking medication for 4/5 days until symptoms resolve Zofran 8 mg prescribed. Patient is agreeable to terms and conditions

## 2022-10-22 ENCOUNTER — Telehealth: Payer: Self-pay | Admitting: Physician Assistant

## 2022-10-22 NOTE — Telephone Encounter (Signed)
Call and spoke with patient. Patient advised

## 2022-10-22 NOTE — Telephone Encounter (Signed)
I think it is a good idea but if you want to stablize a little long ok too. But only take 1 tablet daily.

## 2022-10-22 NOTE — Telephone Encounter (Signed)
Patient called still using bathroom ,no appetite and nausea is that normal what should he now do? He followed instructions as far as stopping the medication.

## 2022-10-22 NOTE — Telephone Encounter (Signed)
Patient now wants to know if he should start back taking rybelsus.[name]?

## 2022-10-22 NOTE — Telephone Encounter (Signed)
Called and spoke to patient. Patient advised

## 2022-10-26 LAB — CBC WITH DIFFERENTIAL/PLATELET
Absolute Monocytes: 546 cells/uL (ref 200–950)
Basophils Absolute: 37 cells/uL (ref 0–200)
Basophils Relative: 0.7 %
Eosinophils Absolute: 58 cells/uL (ref 15–500)
Eosinophils Relative: 1.1 %
HCT: 47.1 % (ref 38.5–50.0)
Hemoglobin: 16.4 g/dL (ref 13.2–17.1)
Lymphs Abs: 1844 cells/uL (ref 850–3900)
MCH: 31.4 pg (ref 27.0–33.0)
MCHC: 34.8 g/dL (ref 32.0–36.0)
MCV: 90.2 fL (ref 80.0–100.0)
MPV: 10.2 fL (ref 7.5–12.5)
Monocytes Relative: 10.3 %
Neutro Abs: 2814 cells/uL (ref 1500–7800)
Neutrophils Relative %: 53.1 %
Platelets: 191 10*3/uL (ref 140–400)
RBC: 5.22 10*6/uL (ref 4.20–5.80)
RDW: 13.3 % (ref 11.0–15.0)
Total Lymphocyte: 34.8 %
WBC: 5.3 10*3/uL (ref 3.8–10.8)

## 2022-10-26 LAB — COMPLETE METABOLIC PANEL WITH GFR
AG Ratio: 2 (calc) (ref 1.0–2.5)
ALT: 12 U/L (ref 9–46)
AST: 13 U/L (ref 10–35)
Albumin: 4.5 g/dL (ref 3.6–5.1)
Alkaline phosphatase (APISO): 54 U/L (ref 35–144)
BUN: 13 mg/dL (ref 7–25)
CO2: 28 mmol/L (ref 20–32)
Calcium: 9.9 mg/dL (ref 8.6–10.3)
Chloride: 100 mmol/L (ref 98–110)
Creat: 1.28 mg/dL (ref 0.70–1.30)
Globulin: 2.2 g/dL (calc) (ref 1.9–3.7)
Glucose, Bld: 78 mg/dL (ref 65–99)
Potassium: 4.3 mmol/L (ref 3.5–5.3)
Sodium: 139 mmol/L (ref 135–146)
Total Bilirubin: 0.8 mg/dL (ref 0.2–1.2)
Total Protein: 6.7 g/dL (ref 6.1–8.1)
eGFR: 66 mL/min/{1.73_m2} (ref >=60)

## 2022-10-26 LAB — LIPID PANEL W/REFLEX DIRECT LDL
Cholesterol: 111 mg/dL (ref <200)
HDL: 57 mg/dL (ref >=40)
LDL Cholesterol (Calc): 35 mg/dL (calc)
Non-HDL Cholesterol (Calc): 54 mg/dL (calc) (ref <130)
Total CHOL/HDL Ratio: 1.9 (calc) (ref <5.0)
Triglycerides: 109 mg/dL (ref <150)

## 2022-10-26 LAB — TSH: TSH: 1.88 mIU/L (ref 0.40–4.50)

## 2022-10-26 LAB — PSA: PSA: 0.17 ng/mL (ref <=4.00)

## 2022-10-26 NOTE — Progress Notes (Signed)
Ronalee Belts,   PSA looks great.  Thyroid looks great.  LDL to goal.  Kidney, liver, glucose look great.   Labs look GREAT.

## 2022-11-07 ENCOUNTER — Telehealth: Payer: Self-pay | Admitting: Physician Assistant

## 2022-11-07 NOTE — Telephone Encounter (Signed)
Pt states that he was on Rybelsus '3mg'$  and [name] changed it to '7mg'$  , Patient wants to know what to do after this dosage is finished? He also states that this is causing him a lot of nausea. Please advise patient

## 2022-11-07 NOTE — Telephone Encounter (Signed)
Message sent via Mychart to patient and left voice mail message requesting a return call on listed home #

## 2022-11-07 NOTE — Telephone Encounter (Signed)
We would stay at '7mg'$  for a while. It does go up to 14 but do not think we will need it. Usually side effects get better once been on new dose for at least 2-3 weeks.

## 2022-11-09 NOTE — Telephone Encounter (Signed)
Ok thank you. Please document nausea and vomiting on intolerance/allergy list for patient.

## 2022-11-12 NOTE — Telephone Encounter (Signed)
Patient made aware.

## 2022-11-12 NOTE — Telephone Encounter (Signed)
Once you stop it should be better and better each day but it does stay in system for about 5-6 weeks after your last dose.

## 2022-11-12 NOTE — Telephone Encounter (Signed)
Patient called and LVM this AM stating he is still feeling nauseated and sluggish. He would like to know how long to expect until this goes away if from the Rybelsus?

## 2022-11-19 MED ORDER — NIRMATRELVIR/RITONAVIR (PAXLOVID)TABLET: 3.0000 | ORAL_TABLET | Freq: Two times a day (BID) | ORAL | 0 refills | Status: AC

## 2022-11-30 ENCOUNTER — Other Ambulatory Visit: Payer: Self-pay | Admitting: Physician Assistant

## 2022-11-30 DIAGNOSIS — E1169 Type 2 diabetes mellitus with other specified complication: Secondary | ICD-10-CM

## 2022-12-13 LAB — COLOGUARD: COLOGUARD: NEGATIVE

## 2022-12-14 NOTE — Progress Notes (Signed)
Negative cologuard. Great news. Re-screen in 3 years.

## 2023-01-09 ENCOUNTER — Ambulatory Visit: Payer: 59 | Admitting: Physician Assistant

## 2023-01-09 ENCOUNTER — Encounter: Payer: Self-pay | Admitting: Physician Assistant

## 2023-01-09 VITALS — BP 112/78 | HR 75 | Ht 70.0 in | Wt 197.0 lb

## 2023-01-09 DIAGNOSIS — F319 Bipolar disorder, unspecified: Secondary | ICD-10-CM

## 2023-01-09 DIAGNOSIS — I10 Essential (primary) hypertension: Secondary | ICD-10-CM

## 2023-01-09 DIAGNOSIS — Z87442 Personal history of urinary calculi: Secondary | ICD-10-CM | POA: Diagnosis not present

## 2023-01-09 DIAGNOSIS — E1169 Type 2 diabetes mellitus with other specified complication: Secondary | ICD-10-CM

## 2023-01-09 DIAGNOSIS — R10A2 Flank pain, left side: Secondary | ICD-10-CM

## 2023-01-09 DIAGNOSIS — R109 Unspecified abdominal pain: Secondary | ICD-10-CM

## 2023-01-09 DIAGNOSIS — E785 Hyperlipidemia, unspecified: Secondary | ICD-10-CM

## 2023-01-09 LAB — POCT UA - MICROALBUMIN
Creatinine, POC: 50 mg/dL
Microalbumin Ur, POC: 10 mg/L

## 2023-01-09 LAB — POCT URINALYSIS DIP (CLINITEK)
Bilirubin, UA: NEGATIVE
Blood, UA: NEGATIVE
Glucose, UA: 500 mg/dL — AB
Leukocytes, UA: NEGATIVE
Nitrite, UA: NEGATIVE
POC PROTEIN,UA: NEGATIVE
Spec Grav, UA: 1.015 (ref 1.010–1.025)
Urobilinogen, UA: 0.2 E.U./dL
pH, UA: 6 (ref 5.0–8.0)

## 2023-01-09 LAB — POCT GLYCOSYLATED HEMOGLOBIN (HGB A1C): Hemoglobin A1C: 5.6 % (ref 4.0–5.6)

## 2023-01-09 MED ORDER — LINAGLIPTIN 5 MG PO TABS
5.00 mg | ORAL_TABLET | Freq: Every day | ORAL | 0 refills | Status: DC
Start: 2023-01-09 — End: 2023-04-08

## 2023-01-09 MED ORDER — TAMSULOSIN HCL 0.4 MG PO CAPS
0.4000 mg | ORAL_CAPSULE | Freq: Every day | ORAL | 0 refills | Status: DC
Start: 2023-01-09 — End: 2023-04-10

## 2023-01-09 NOTE — Patient Instructions (Signed)
Flomax for 15 days.  Start [place] with xigduo.

## 2023-01-09 NOTE — Progress Notes (Unsigned)
Established Patient Office Visit  Subjective   Patient ID: Mark Stevens, male    DOB: 1966/06/02  Age: 57 y.o. MRN: [id number]  No chief complaint on file.   HPI  Patient Active Problem List   Diagnosis Date Noted   Chronic rhinitis 12/21/2019   Recurrent sinusitis 12/21/2019   Seasonal allergies 06/29/2019   Sinus drainage 06/29/2019   Cough 06/29/2019   Gastroesophageal reflux disease without esophagitis 03/10/2019   Hyperlipidemia LDL goal <70 03/10/2019   Mixed hyperlipidemia 03/10/2019   Seborrheic dermatitis 03/10/2019   Hypertriglyceridemia 03/19/2018   Tinea versicolor 10/16/2017   Seborrheic keratoses 10/16/2017   Nephrolithiasis 06/05/2017   Pigmented nevus 01/04/2017   Medial epicondylitis of elbow, left 10/09/2016   Type 2 diabetes mellitus 02/02/2015   Hyperglycemia 01/05/2015   Essential hypertension 11/02/2014   Obstructive sleep apnea 12/03/2013   Sleep disturbance 10/16/2013   Snoring 10/16/2013   Bipolar 1 disorder 10/16/2013   Past Medical History:  Diagnosis Date   Bipolar 1 disorder    Depression    Hyperlipidemia    Hypertension    History reviewed. No pertinent surgical history. Family History  Problem Relation Age of Onset   Atrial fibrillation Father    Prostate cancer Other        uncle   Congestive Heart Failure Other    Allergies  Allergen Reactions   Epinephrine    Prednisone     Vision loss   Semaglutide Nausea And Vomiting      ROS    Objective:     There were no vitals taken for this visit. BP Readings from Last 3 Encounters:  01/09/23 112/78  10/09/22 116/78  04/20/22 97/65   Wt Readings from Last 3 Encounters:  01/09/23 197 lb (89.4 kg)  10/09/22 203 lb (92.1 kg)  04/20/22 198 lb (89.8 kg)      Physical Exam   .. Results for orders placed or performed in visit on 10/09/22  PSA  Result Value Ref Range   PSA 0.17 < OR = 4.00 ng/mL  TSH  Result Value Ref Range   TSH 1.88 0.40 - 4.50 mIU/L   Lipid Panel w/reflex Direct LDL  Result Value Ref Range   Cholesterol 111 <200 mg/dL   HDL 57 > OR = 40 mg/dL   Triglycerides 975 <883 mg/dL   LDL Cholesterol (Calc) 35 mg/dL (calc)   Total CHOL/HDL Ratio 1.9 <5.0 (calc)   Non-HDL Cholesterol (Calc) 54 <254 mg/dL (calc)  COMPLETE METABOLIC PANEL WITH GFR  Result Value Ref Range   Glucose, Bld 78 65 - 99 mg/dL   BUN 13 7 - 25 mg/dL   Creat 9.82 6.41 - 5.83 mg/dL   eGFR 66 > OR = 60 EN/MMH/6.80S8   BUN/Creatinine Ratio SEE NOTE: 6 - 22 (calc)   Sodium 139 135 - 146 mmol/L   Potassium 4.3 3.5 - 5.3 mmol/L   Chloride 100 98 - 110 mmol/L   CO2 28 20 - 32 mmol/L   Calcium 9.9 8.6 - 10.3 mg/dL   Total Protein 6.7 6.1 - 8.1 g/dL   Albumin 4.5 3.6 - 5.1 g/dL   Globulin 2.2 1.9 - 3.7 g/dL (calc)   AG Ratio 2.0 1.0 - 2.5 (calc)   Total Bilirubin 0.8 0.2 - 1.2 mg/dL   Alkaline phosphatase (APISO) 54 35 - 144 U/L   AST 13 10 - 35 U/L   ALT 12 9 - 46 U/L  CBC with Differential/Platelet  Result Value Ref  Range   WBC 5.3 3.8 - 10.8 Thousand/uL   RBC 5.22 4.20 - 5.80 Million/uL   Hemoglobin 16.4 13.2 - 17.1 g/dL   HCT 09.8 11.9 - 14.7 %   MCV 90.2 80.0 - 100.0 fL   MCH 31.4 27.0 - 33.0 pg   MCHC 34.8 32.0 - 36.0 g/dL   RDW 82.9 56.2 - 13.0 %   Platelets 191 140 - 400 Thousand/uL   MPV 10.2 7.5 - 12.5 fL   Neutro Abs 2,814 1,500 - 7,800 cells/uL   Lymphs Abs 1,844 850 - 3,900 cells/uL   Absolute Monocytes 546 200 - 950 cells/uL   Eosinophils Absolute 58 15 - 500 cells/uL   Basophils Absolute 37 0 - 200 cells/uL   Neutrophils Relative % 53.1 %   Total Lymphocyte 34.8 %   Monocytes Relative 10.3 %   Eosinophils Relative 1.1 %   Basophils Relative 0.7 %  Cologuard  Result Value Ref Range   COLOGUARD Negative Negative  POCT glycosylated hemoglobin (Hb A1C)  Result Value Ref Range   Hemoglobin A1C 6.5 (A) 4.0 - 5.6 %   HbA1c POC (<> result, manual entry)     HbA1c, POC (prediabetic range)     HbA1c, POC (controlled diabetic  range)    POCT UA - Microalbumin  Result Value Ref Range   Microalbumin Ur, POC 30 mg/L   Creatinine, POC 300 mg/dL   Albumin/Creatinine Ratio, Urine, POC <30        Assessment & Plan:     Tandy Gaw, PA-C

## 2023-01-10 ENCOUNTER — Encounter: Payer: Self-pay | Admitting: Physician Assistant

## 2023-01-10 MED ORDER — DIVALPROEX SODIUM 500 MG PO DR TAB
500.0000 mg | DELAYED_RELEASE_TABLET | Freq: Two times a day (BID) | ORAL | 3 refills | Status: AC
Start: 2023-01-10 — End: ?

## 2023-01-10 MED ORDER — ATORVASTATIN CALCIUM 10 MG PO TABS
10.0000 mg | ORAL_TABLET | Freq: Every day | ORAL | 1 refills | Status: DC
Start: 2023-01-10 — End: 2023-07-09

## 2023-01-17 ENCOUNTER — Other Ambulatory Visit: Payer: Self-pay | Admitting: Physician Assistant

## 2023-01-17 DIAGNOSIS — Z87442 Personal history of urinary calculi: Secondary | ICD-10-CM

## 2023-01-17 DIAGNOSIS — R109 Unspecified abdominal pain: Secondary | ICD-10-CM

## 2023-02-27 ENCOUNTER — Ambulatory Visit (INDEPENDENT_AMBULATORY_CARE_PROVIDER_SITE_OTHER): Payer: 59 | Admitting: Family Medicine

## 2023-02-27 ENCOUNTER — Other Ambulatory Visit: Payer: Self-pay | Admitting: Family Medicine

## 2023-02-27 ENCOUNTER — Encounter: Payer: Self-pay | Admitting: Family Medicine

## 2023-02-27 ENCOUNTER — Telehealth: Payer: Self-pay | Admitting: Family Medicine

## 2023-02-27 ENCOUNTER — Other Ambulatory Visit: Payer: Self-pay | Admitting: Physician Assistant

## 2023-02-27 VITALS — BP 97/65 | HR 87 | Ht 70.0 in | Wt 200.0 lb

## 2023-02-27 DIAGNOSIS — E1169 Type 2 diabetes mellitus with other specified complication: Secondary | ICD-10-CM

## 2023-02-27 DIAGNOSIS — L259 Unspecified contact dermatitis, unspecified cause: Secondary | ICD-10-CM | POA: Insufficient documentation

## 2023-02-27 DIAGNOSIS — M7581 Other shoulder lesions, right shoulder: Secondary | ICD-10-CM | POA: Diagnosis not present

## 2023-02-27 DIAGNOSIS — Z7984 Long term (current) use of oral hypoglycemic drugs: Secondary | ICD-10-CM

## 2023-02-27 DIAGNOSIS — L219 Seborrheic dermatitis, unspecified: Secondary | ICD-10-CM | POA: Diagnosis not present

## 2023-02-27 DIAGNOSIS — M7501 Adhesive capsulitis of right shoulder: Secondary | ICD-10-CM | POA: Insufficient documentation

## 2023-02-27 MED ORDER — TRIAMCINOLONE ACETONIDE 0.1 % EX OINT: 1.0000 | TOPICAL_OINTMENT | Freq: Two times a day (BID) | CUTANEOUS | 0 refills | Status: DC

## 2023-02-27 MED ORDER — MELOXICAM 15 MG PO TABS: 15.0000 mg | ORAL_TABLET | Freq: Every day | ORAL | 0 refills | Status: DC

## 2023-02-27 MED ORDER — DAPAGLIFLOZIN PRO-METFORMIN ER 10-1000 MG PO TB24: 1.0000 | ORAL_TABLET | Freq: Every day | ORAL | 0 refills | Status: DC

## 2023-02-27 NOTE — Assessment & Plan Note (Signed)
Has had reaction to oral steroids in the past.  I think he could probably tolerate joint injection but declines due to not liking needles.  Adding on meloxicam for couple weeks with shoulder rehab exercises.  He will let me know if not improving with this.

## 2023-02-27 NOTE — Telephone Encounter (Signed)
Please check to see if PA is required.  If excluded from insurance I will defer to [facility] once she returns for an alternative.

## 2023-02-27 NOTE — Telephone Encounter (Signed)
Patient called he stated that his Dapagliflozin Pro-Mefformin ER Xigduo xl 10-1000mg  is not covered under his insurance he is requesting a PA or a different medication please advise

## 2023-02-27 NOTE — Assessment & Plan Note (Signed)
Triamcinolone renewed 

## 2023-02-27 NOTE — Progress Notes (Signed)
Mark Stevens - 57 y.o. male MRN [id number]  Date of birth: Jul 08, 1966  Subjective Chief Complaint  Patient presents with   Arm Pain   Poison [place]    HPI Mark Stevens is a 57 year old male here today with complaint of arm pain.  Pain located in the right shoulder with radiation into the upper arm area.  Also having pain in the left arm that started more recently.  This is more in the bicep.  He does admit that he is using the left arm more due to his right arm pain.  He does not recall any injury to either arm.  Denies numbness or tingling.  No change in strength.  Range of motion is decreased due to pain.  He has not really tried anything so far to help with this.  He also has rash on the right forearm.  This is similar to poison ivy rash he has had in the past.  Triamcinolone has worked well for him.  Needs refill of his Xigduo as well.  POP-Q like people like the [employer] negative health admit  ROS:  A comprehensive ROS was completed and negative except as noted per HPI  Allergies  Allergen Reactions   Epinephrine    Prednisone     Vision loss   Semaglutide Nausea And Vomiting    Past Medical History:  Diagnosis Date   Bipolar 1 disorder (HCC)    Depression    Hyperlipidemia    Hypertension     History reviewed. No pertinent surgical history.  Social History   Socioeconomic History   Marital status: Married    Spouse name: Not on file   Number of children: Not on file   Years of education: Not on file   Highest education level: Not on file  Occupational History   Not on file  Tobacco Use   Smoking status: Never   Smokeless tobacco: Never  Vaping Use   Vaping Use: Never used  Substance and Sexual Activity   Alcohol use: No   Drug use: No   Sexual activity: Not Currently    Partners: Female  Other Topics Concern   Not on file  Social History Narrative   Not on file   Social Determinants of Health   Financial Resource Strain: Not on file  Food Insecurity: Not on  file  Transportation Needs: Not on file  Physical Activity: Not on file  Stress: Not on file  Social Connections: Not on file    Family History  Problem Relation Age of Onset   Atrial fibrillation Father    Prostate cancer Other        uncle   Congestive Heart Failure Other     Health Maintenance  Topic Date Due   OPHTHALMOLOGY EXAM  Never done   COVID-19 Vaccine (3 - Moderna risk series) 08/20/2020   Zoster Vaccines- Shingrix (1 of 2) 04/10/2023 (Originally 01/03/1985)   HIV Screening  10/10/2023 (Originally 01/03/1981)   INFLUENZA VACCINE  05/02/2023   HEMOGLOBIN A1C  07/11/2023   FOOT EXAM  10/10/2023   Diabetic kidney evaluation - eGFR measurement  10/26/2023   Diabetic kidney evaluation - Urine ACR  01/09/2024   Fecal DNA (Cologuard)  12/03/2025   DTaP/Tdap/Td (2 - Td or Tdap) 01/22/2026   Hepatitis C Screening  Completed   HPV VACCINES  Aged Out     ----------------------------------------------------------------------------------------------------------------------------------------------------------------------------------------------------------------- Physical Exam BP 97/65 (BP Location: Left Arm, Patient Position: Sitting, Cuff Size: Normal)   Pulse 87  Ht 5\' 10"  (1.778 m)   Wt 200 lb (90.7 kg)   SpO2 97%   BMI 28.70 kg/m   Physical Exam Constitutional:      Appearance: Normal appearance.  HENT:     Head: Normocephalic and atraumatic.  Eyes:     General: No scleral icterus. Musculoskeletal:     Cervical back: Neck supple.     Comments: Patient pressure right shoulder is limited with abduction and extension.  Weakness with external rotation as well as resisted abduction.  Negative drop arm sign.  He does have pain with impingement testing as well as positive empty can with pain.  Skin:    Comments: Slightly raised and indurated rash to the right forearm without erythema or warmth.  Neurological:     Mental Status: He is alert.  Psychiatric:        Mood  and Affect: Mood normal.        Behavior: Behavior normal.     ------------------------------------------------------------------------------------------------------------------------------------------------------------------------------------------------------------------- Assessment and Plan  Type 2 diabetes mellitus (HCC) I did go ahead and refill his Xigduo today.  He will keep his appointment with Tandy Gaw for July.  Contact dermatitis Triamcinolone renewed.  Rotator cuff tendinitis, right Has had reaction to oral steroids in the past.  I think he could probably tolerate joint injection but declines due to not liking needles.  Adding on meloxicam for couple weeks with shoulder rehab exercises.  He will let me know if not improving with this.   Meds ordered this encounter  Medications   Dapagliflozin Pro-metFORMIN ER (XIGDUO XR) 07-999 MG TB24    Sig: Take 1 tablet by mouth daily.    Dispense:  90 tablet    Refill:  0   triamcinolone ointment (KENALOG) 0.1 %    Sig: Apply 1 Application topically 2 (two) times daily. Applying sparingly to affected areas; do not use for more than 2 weeks in a row    Dispense:  30 g    Refill:  0   meloxicam (MOBIC) 15 MG tablet    Sig: Take 1 tablet (15 mg total) by mouth daily.    Dispense:  30 tablet    Refill:  0    No follow-ups on file.    This visit occurred during the SARS-CoV-2 public health emergency.  Safety protocols were in place, including screening questions prior to the visit, additional usage of staff PPE, and extensive cleaning of exam room while observing appropriate contact time as indicated for disinfecting solutions.

## 2023-02-27 NOTE — Patient Instructions (Signed)
Take meloxicam daily x2 weeks then as needed.  Try rehab exercise.   Let me know if symptoms are not improving after a couple of weeks.

## 2023-02-27 NOTE — Assessment & Plan Note (Signed)
I did go ahead and refill his Xigduo today.  He will keep his appointment with Tandy Gaw for July.

## 2023-02-28 ENCOUNTER — Telehealth: Payer: Self-pay

## 2023-02-28 NOTE — Telephone Encounter (Signed)
Initiated Prior authorization [id number] XR 10-1000MG  er tablets  Via: Covermymeds Case/Key:[id number]  Status: approved as of 02/28/23  Reason:approved through 02/28/2024. Notified Pt via: Mychart

## 2023-03-07 ENCOUNTER — Ambulatory Visit (INDEPENDENT_AMBULATORY_CARE_PROVIDER_SITE_OTHER): Payer: 59

## 2023-03-07 ENCOUNTER — Ambulatory Visit: Payer: 59 | Admitting: Sports Medicine

## 2023-03-07 DIAGNOSIS — M7501 Adhesive capsulitis of right shoulder: Secondary | ICD-10-CM

## 2023-03-07 MED ORDER — TRIAZOLAM 0.25 MG PO TABS
ORAL_TABLET | ORAL | 0 refills | Status: DC
Start: 2023-03-07 — End: 2023-07-22

## 2023-03-07 NOTE — Assessment & Plan Note (Signed)
This is a pleasant 57 year old male, he has a history of diabetes, he has had several weeks of increasing shoulder pain with progressive loss of motion. On exam he has significant loss of external rotation as is typical for glenohumeral osteoarthritis or adhesive capsulitis, considering the rapidity that this developed I am leaning towards adhesive capsulitis. Initial treatment is typically a glenohumeral joint injection and hydrodistention of the capsule. 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 He will also need formal physical therapy after the injection.

## 2023-03-07 NOTE — Progress Notes (Signed)
    Procedures performed today:    None.  Independent interpretation of notes and tests performed by another provider:   None.  Brief History, Exam, Impression, and Recommendations:    Adhesive capsulitis of right shoulder This is a pleasant 57 year old male, he has a history of diabetes, he has had several weeks of increasing shoulder pain with progressive loss of motion. On exam he has significant loss of external rotation as is typical for glenohumeral osteoarthritis or adhesive capsulitis, considering the rapidity that this developed I am leaning towards adhesive capsulitis. Initial treatment is typically a glenohumeral joint injection and hydrodistention of the capsule. 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 He will also need formal physical therapy after the injection.    ____________________________________________ Ihor Austin. Benjamin Stain, M.D., ABFM., CAQSM., AME. Primary Care and Sports Medicine [facility]  Adjunct Professor of Family Medicine  [employer]  Restaurant manager, fast food

## 2023-03-08 ENCOUNTER — Other Ambulatory Visit (INDEPENDENT_AMBULATORY_CARE_PROVIDER_SITE_OTHER): Payer: 59

## 2023-03-08 ENCOUNTER — Ambulatory Visit: Payer: 59 | Admitting: Sports Medicine

## 2023-03-08 DIAGNOSIS — M7501 Adhesive capsulitis of right shoulder: Secondary | ICD-10-CM

## 2023-03-08 NOTE — Progress Notes (Signed)
    Procedures performed today:    Procedure: Real-time Ultrasound Guided injection of the right glenohumeral injection Device: Samsung HS60  Verbal informed consent obtained.  Time-out conducted.  Noted no overlying erythema, induration, or other signs of local infection.  Skin prepped in a sterile fashion.  Local anesthesia: Topical Ethyl chloride.  With sterile technique and under real time ultrasound guidance: No arthritic changes noted, 1 cc Kenalog 40, 2 cc lidocaine, 2 cc bupivacaine injected easily, syringe switched and 10 cc sterile saline used to fully distend the joint Completed without difficulty  Advised to call if fevers/chills, erythema, induration, drainage, or persistent bleeding.  Images permanently stored and available for review in PACS.  Impression: Technically successful ultrasound guided injection.  Independent interpretation of notes and tests performed by another provider:   None.  Brief History, Exam, Impression, and Recommendations:    Adhesive capsulitis of right shoulder Pleasant 57 year old male, history of diabetes, he said several weeks increasing shoulder pain, progressive loss of motion, at the last visit he had a lot of loss of external rotation, I suspected adhesive capsulitis, we got an x-ray that only showed minimal arthritis, he had a needle phobia so we added triazolam and he returns today for the injection, we did a glenohumeral injection with hydrodistention with saline, he will do some home physical therapy and return to see me in 6 weeks.    ____________________________________________ Mark Stevens. Benjamin Stain, M.D., ABFM., CAQSM., AME. Primary Care and Sports Medicine [facility]  Adjunct Professor of Family Medicine  [place] [employer] [facility] [employer]  Restaurant manager, fast food

## 2023-03-08 NOTE — Assessment & Plan Note (Signed)
Pleasant 57 year old male, history of diabetes, he said several weeks increasing shoulder pain, progressive loss of motion, at the last visit he had a lot of loss of external rotation, I suspected adhesive capsulitis, we got an x-ray that only showed minimal arthritis, he had a needle phobia so we added triazolam and he returns today for the injection, we did a glenohumeral injection with hydrodistention with saline, he will do some home physical therapy and return to see me in 6 weeks.

## 2023-04-07 ENCOUNTER — Other Ambulatory Visit: Payer: Self-pay | Admitting: Physician Assistant

## 2023-04-07 DIAGNOSIS — E1169 Type 2 diabetes mellitus with other specified complication: Secondary | ICD-10-CM

## 2023-04-10 ENCOUNTER — Encounter: Payer: Self-pay | Admitting: Physician Assistant

## 2023-04-10 ENCOUNTER — Ambulatory Visit: Payer: 59 | Admitting: Physician Assistant

## 2023-04-10 VITALS — BP 107/79 | HR 91 | Ht 70.0 in | Wt 196.4 lb

## 2023-04-10 DIAGNOSIS — E1169 Type 2 diabetes mellitus with other specified complication: Secondary | ICD-10-CM

## 2023-04-10 DIAGNOSIS — F319 Bipolar disorder, unspecified: Secondary | ICD-10-CM

## 2023-04-10 DIAGNOSIS — I10 Essential (primary) hypertension: Secondary | ICD-10-CM

## 2023-04-10 LAB — POCT GLYCOSYLATED HEMOGLOBIN (HGB A1C): Hemoglobin A1C: 5.7 % — AB (ref 4.0–5.6)

## 2023-04-10 MED ORDER — LINAGLIPTIN 5 MG PO TABS
5.0000 mg | ORAL_TABLET | Freq: Every day | ORAL | 0 refills | Status: DC
Start: 2023-04-10 — End: 2023-07-22

## 2023-04-10 MED ORDER — BUPROPION HCL ER (XL) 300 MG PO TB24
300.0000 mg | ORAL_TABLET | Freq: Every day | ORAL | 1 refills | Status: AC
Start: 2023-04-10 — End: ?

## 2023-04-10 MED ORDER — DAPAGLIFLOZIN PRO-METFORMIN ER 10-1000 MG PO TB24
1.0000 | ORAL_TABLET | Freq: Every day | ORAL | 1 refills | Status: DC
Start: 2023-04-10 — End: 2023-12-09

## 2023-04-10 MED ORDER — LISINOPRIL 20 MG PO TABS
ORAL_TABLET | ORAL | 1 refills | Status: AC
Start: 2023-04-10 — End: ?

## 2023-04-10 NOTE — Progress Notes (Unsigned)
Established Patient Office Visit  Subjective   Patient ID: Mark Stevens, male    DOB: 04/07/1966  Age: 57 y.o. MRN: [id number]  Chief Complaint  Patient presents with   Medical Management of Chronic Issues    Pt here for follow up on diabetes. Denies any concerns for today's visit.    HPI Pt is a 57 yo male with T2DM, HTN, OSA, HLD who presents to the clinic for follow up and medication refills.   Pt is doing well. He is not checking his sugars. He is taking trajenta and xigduo daily. No open sores or wounds. He eats healthy "sometimes". He is active but no exercising. Denies any CP, palpitations, headaches or vision changes. No SOB or swelling.   Mood is great.   .. Active Ambulatory Problems    Diagnosis Date Noted   Sleep disturbance 10/16/2013   Snoring 10/16/2013   Bipolar 1 disorder (HCC) 10/16/2013   Obstructive sleep apnea 12/03/2013   Essential hypertension 11/02/2014   Hyperglycemia 01/05/2015   Type 2 diabetes mellitus (HCC) 02/02/2015   Medial epicondylitis of elbow, left 10/09/2016   Pigmented nevus 01/04/2017   Nephrolithiasis 06/05/2017   Tinea versicolor 10/16/2017   Seborrheic keratoses 10/16/2017   Hypertriglyceridemia 03/19/2018   Gastroesophageal reflux disease without esophagitis 03/10/2019   Hyperlipidemia LDL goal <70 03/10/2019   Mixed hyperlipidemia 03/10/2019   Seborrheic dermatitis 03/10/2019   Seasonal allergies 06/29/2019   Sinus drainage 06/29/2019   Cough 06/29/2019   Chronic rhinitis 12/21/2019   Recurrent sinusitis 12/21/2019   Contact dermatitis 02/27/2023   Adhesive capsulitis of right shoulder 02/27/2023   Resolved Ambulatory Problems    Diagnosis Date Noted   Poison ivy 05/05/2015   Class 1 obesity due to excess calories with serious comorbidity and body mass index (BMI) of 31.0 to 31.9 in adult 12/06/2017   Past Medical History:  Diagnosis Date   Depression    Hyperlipidemia    Hypertension      Review of Systems   All other systems reviewed and are negative.     Objective:     BP 107/79 (BP Location: Right Arm, Patient Position: Sitting, Cuff Size: Normal)   Pulse 91   Ht 5\' 10"  (1.778 m)   Wt 196 lb 6.4 oz (89.1 kg)   SpO2 96%   BMI 28.18 kg/m  BP Readings from Last 3 Encounters:  04/10/23 107/79  02/27/23 97/65  01/09/23 112/78   Wt Readings from Last 3 Encounters:  04/10/23 196 lb 6.4 oz (89.1 kg)  02/27/23 200 lb (90.7 kg)  01/09/23 197 lb (89.4 kg)     .[name] Results for orders placed or performed in visit on 04/10/23  POCT HgB A1C  Result Value Ref Range   Hemoglobin A1C 5.7 (A) 4.0 - 5.6 %   HbA1c POC (<> result, manual entry)     HbA1c, POC (prediabetic range)     HbA1c, POC (controlled diabetic range)      Physical Exam Constitutional:      Appearance: Normal appearance.  Cardiovascular:     Rate and Rhythm: Normal rate and regular rhythm.     Pulses: Normal pulses.     Heart sounds: Normal heart sounds.  Pulmonary:     Effort: Pulmonary effort is normal.     Breath sounds: Normal breath sounds.  Musculoskeletal:     Right lower leg: No edema.     Left lower leg: No edema.  Neurological:     General: No  focal deficit present.     Mental Status: He is alert and oriented to person, place, and time.  Psychiatric:        Mood and Affect: Mood normal.           Assessment & Plan:  .[name] "[name]" was seen today for medical management of chronic issues.  Diagnoses and all orders for this visit:  Type 2 diabetes mellitus with other specified complication, without long-term current use of insulin (HCC) -     POCT HgB A1C -     Dapagliflozin Pro-metFORMIN ER (XIGDUO XR) 07-999 MG TB24; Take 1 tablet by mouth daily. -     linagliptin (TRADJENTA) 5 MG TABS tablet; Take 1 tablet (5 mg total) by mouth daily.  Bipolar 1 disorder (HCC) -     buPROPion (WELLBUTRIN XL) 300 MG 24 hr tablet; Take 1 tablet (300 mg total) by mouth daily.  Essential hypertension -      lisinopril (ZESTRIL) 20 MG tablet; TAKE 1 TABLET BY MOUTH EVERY DAY FOR BLOOD PRESSURE CONTROL   A1C is to goal Continue same medications and refills sent BP to goal refills sent of lisinopril On statin Needs eye exam Foot exam UTD Declines vaccines Follow up in 3 months  No problems with mood-refilled wellbutrin.     Return in about 6 months (around 10/11/2023).    Tandy Gaw, PA-C

## 2023-04-11 ENCOUNTER — Encounter: Payer: Self-pay | Admitting: Physician Assistant

## 2023-04-19 ENCOUNTER — Ambulatory Visit: Payer: 59 | Admitting: Sports Medicine

## 2023-04-22 ENCOUNTER — Ambulatory Visit: Payer: 59 | Admitting: Sports Medicine

## 2023-04-22 DIAGNOSIS — M7501 Adhesive capsulitis of right shoulder: Secondary | ICD-10-CM | POA: Diagnosis not present

## 2023-04-22 NOTE — Assessment & Plan Note (Signed)
This pleasant 57 year old male with history of diabetes returns, he had several weeks of increasing right shoulder pain, progressive loss of motion, particularly loss of external rotation, we suspect adhesive capsulitis, x-ray showed only minimal arthritis. At the last visit we did a glenohumeral joint injection with hydrodistention. He returns today almost completely symptom-free, happy with how things are going, he can return to see me as needed.

## 2023-04-22 NOTE — Progress Notes (Signed)
    Procedures performed today:    None.  Independent interpretation of notes and tests performed by another provider:   None.  Brief History, Exam, Impression, and Recommendations:    Adhesive capsulitis of right shoulder This pleasant 57 year old male with history of diabetes returns, he had several weeks of increasing right shoulder pain, progressive loss of motion, particularly loss of external rotation, we suspect adhesive capsulitis, x-ray showed only minimal arthritis. At the last visit we did a glenohumeral joint injection with hydrodistention. He returns today almost completely symptom-free, happy with how things are going, he can return to see me as needed.    ____________________________________________ Mark Stevens. Benjamin Stain, M.D., ABFM., CAQSM., AME. Primary Care and Sports Medicine [facility]  Adjunct Professor of Family Medicine  [place] [employer] [facility] [employer]  Restaurant manager, fast food

## 2023-06-14 ENCOUNTER — Telehealth: Payer: Self-pay | Admitting: Physician Assistant

## 2023-06-14 NOTE — Telephone Encounter (Signed)
That is fine 

## 2023-06-14 NOTE — Telephone Encounter (Signed)
Left Detailed VM for patient to call back.

## 2023-06-14 NOTE — Telephone Encounter (Signed)
Pt called and wanted to know if it is okay for him to start taking Collagen with all his current medications. Pt states that he heard this could possibly help with his shoulder issue. Please call patient to advise.

## 2023-07-04 ENCOUNTER — Other Ambulatory Visit: Payer: Self-pay | Admitting: Physician Assistant

## 2023-07-04 DIAGNOSIS — E785 Hyperlipidemia, unspecified: Secondary | ICD-10-CM

## 2023-07-09 ENCOUNTER — Other Ambulatory Visit: Payer: Self-pay | Admitting: Physician Assistant

## 2023-07-09 DIAGNOSIS — E785 Hyperlipidemia, unspecified: Secondary | ICD-10-CM

## 2023-07-09 MED ORDER — ATORVASTATIN CALCIUM 10 MG PO TABS
10.0000 mg | ORAL_TABLET | Freq: Every day | ORAL | 3 refills | Status: AC
Start: 2023-07-09 — End: ?

## 2023-07-10 ENCOUNTER — Telehealth: Payer: Self-pay | Admitting: Physician Assistant

## 2023-07-10 NOTE — Telephone Encounter (Signed)
Patient called stating he thinks his prescription of XIGDUO is causing anxiety, jitters and low blood sugar. Please advise.  Pt did not want to speak to a triage nurse.

## 2023-07-11 NOTE — Telephone Encounter (Signed)
Spoke with patient.  States he does not want the [place] - he has been checking his BP with fingersticks and will continue this way.  Oct 8th low reading of 73  and later that day 147 Yesterday reading was 102 He states his symptoms are an uneasy feeling  shakiness, jittery  That is sometime relieved with eating for a short while,  Wanting to know about any change recommended to medication.  O.k. to leave a detailed voicemail message on patient home #

## 2023-07-11 NOTE — Telephone Encounter (Signed)
Patient wanting to know if should come off of xigduo and tradjenta or just one ?

## 2023-07-11 NOTE — Telephone Encounter (Signed)
Okay to hold the Tradjenta and continue with the Xigduo.  And then have him schedule follow-up [name].

## 2023-07-12 NOTE — Telephone Encounter (Signed)
Patient scheduled with Tandy Gaw for Jul 22, 2023.

## 2023-07-22 ENCOUNTER — Encounter: Payer: Self-pay | Admitting: Physician Assistant

## 2023-07-22 ENCOUNTER — Ambulatory Visit: Payer: 59 | Admitting: Physician Assistant

## 2023-07-22 VITALS — BP 111/79 | HR 79 | Ht 70.0 in | Wt 193.0 lb

## 2023-07-22 DIAGNOSIS — M7712 Lateral epicondylitis, left elbow: Secondary | ICD-10-CM | POA: Diagnosis not present

## 2023-07-22 DIAGNOSIS — E785 Hyperlipidemia, unspecified: Secondary | ICD-10-CM

## 2023-07-22 DIAGNOSIS — Z7984 Long term (current) use of oral hypoglycemic drugs: Secondary | ICD-10-CM

## 2023-07-22 DIAGNOSIS — I10 Essential (primary) hypertension: Secondary | ICD-10-CM | POA: Diagnosis not present

## 2023-07-22 DIAGNOSIS — E162 Hypoglycemia, unspecified: Secondary | ICD-10-CM

## 2023-07-22 DIAGNOSIS — E1169 Type 2 diabetes mellitus with other specified complication: Secondary | ICD-10-CM | POA: Diagnosis not present

## 2023-07-22 LAB — POCT GLYCOSYLATED HEMOGLOBIN (HGB A1C): Hemoglobin A1C: 5.3 % (ref 4.0–5.6)

## 2023-07-22 MED ORDER — DICLOFENAC SODIUM 1 % EX GEL
4.0000 g | Freq: Four times a day (QID) | CUTANEOUS | 1 refills | Status: AC
Start: 2023-07-22 — End: ?

## 2023-07-22 NOTE — Progress Notes (Unsigned)
Established Patient Office Visit  Subjective   Patient ID: Mark Stevens, male    DOB: 16-Nov-1965  Age: 57 y.o. MRN: [id number]  Chief Complaint  Patient presents with   Medical Management of Chronic Issues    5.7 last a1c    HPI Pt is a 57 yo male with T2DM, HTN, HLD, OSA who presents to the clinic for follow up.   Pt stopped trajenta because blood sugars seemed to be dropping in the 60s on it. He since figured out it was because he was skipping breakfast and sugars were dropping in the late morning. Once he started eating a little sooner he no longer had sugar drops of symptoms of hypoglycemia. He has only been on xigduo. He is doing well. He denies any CP, palpitations, headaches or vision changes. He is active but no regular exercise.   Pt does report some left elbow tenderness that started a few weeks ago after doing exercises for his adhesive capsulitis. Pain comes and goes but at times he struggles to hold a glass of tea. Not tried anything to make better.   .. Active Ambulatory Problems    Diagnosis Date Noted   Sleep disturbance 10/16/2013   Snoring 10/16/2013   Bipolar 1 disorder (HCC) 10/16/2013   Obstructive sleep apnea 12/03/2013   Essential hypertension 11/02/2014   Hyperglycemia 01/05/2015   Type 2 diabetes mellitus (HCC) 02/02/2015   Medial epicondylitis of elbow, left 10/09/2016   Pigmented nevus 01/04/2017   Nephrolithiasis 06/05/2017   Tinea versicolor 10/16/2017   Seborrheic keratoses 10/16/2017   Hypertriglyceridemia 03/19/2018   Gastroesophageal reflux disease without esophagitis 03/10/2019   Hyperlipidemia LDL goal <70 03/10/2019   Mixed hyperlipidemia 03/10/2019   Seborrheic dermatitis 03/10/2019   Seasonal allergies 06/29/2019   Sinus drainage 06/29/2019   Cough 06/29/2019   Chronic rhinitis 12/21/2019   Recurrent sinusitis 12/21/2019   Contact dermatitis 02/27/2023   Adhesive capsulitis of right shoulder 02/27/2023   Left tennis elbow  07/23/2023   Hypoglycemia 07/23/2023   Resolved Ambulatory Problems    Diagnosis Date Noted   Poison ivy 05/05/2015   Class 1 obesity due to excess calories with serious comorbidity and body mass index (BMI) of 31.0 to 31.9 in adult 12/06/2017   Past Medical History:  Diagnosis Date   Depression    Hyperlipidemia    Hypertension      ROS See HPI.    Objective:     BP 111/79   Pulse 79   Ht 5\' 10"  (1.778 m)   Wt 193 lb (87.5 kg)   SpO2 99%   BMI 27.69 kg/m  BP Readings from Last 3 Encounters:  07/22/23 111/79  04/10/23 107/79  02/27/23 97/65   Wt Readings from Last 3 Encounters:  07/22/23 193 lb (87.5 kg)  04/10/23 196 lb 6.4 oz (89.1 kg)  02/27/23 200 lb (90.7 kg)    .[name] Results for orders placed or performed in visit on 07/22/23  POCT HgB A1C  Result Value Ref Range   Hemoglobin A1C 5.3 4.0 - 5.6 %   HbA1c POC (<> result, manual entry)     HbA1c, POC (prediabetic range)     HbA1c, POC (controlled diabetic range)       Physical Exam Constitutional:      Appearance: Normal appearance.  HENT:     Head: Normocephalic.  Cardiovascular:     Rate and Rhythm: Normal rate and regular rhythm.  Pulmonary:     Effort: Pulmonary effort is  normal.     Breath sounds: Normal breath sounds.  Musculoskeletal:     Right lower leg: No edema.     Left lower leg: No edema.     Comments: Left tenderness over lateral epicondyle.   Neurological:     General: No focal deficit present.     Mental Status: He is alert and oriented to person, place, and time.  Psychiatric:        Mood and Affect: Mood normal.         Assessment & Plan:  .[name] "[name]" was seen today for medical management of chronic issues.  Diagnoses and all orders for this visit:  Type 2 diabetes mellitus with other specified complication, without long-term current use of insulin (HCC) -     POCT HgB A1C  Hyperlipidemia LDL goal <70  Left lateral epicondylitis -     diclofenac Sodium  (VOLTAREN) 1 % GEL; Apply 4 g topically 4 (four) times daily. To affected joint.  Essential hypertension  Hypoglycemia   A1C looks great. Stay on trajenta.  Continue xigduo. Continue with diabetic diet and try to stay active and walk daily.  Needs eye exam. BP to goal.  On statin.  Declined all vaccines.   Discussed lateral tennis elbow Get brace to wear as needed and for the next 2 weeks Voltaren gel sent to use over area Ice as needed Watch overusing HO given for exercises Follow up if not improving or worsening   Tandy Gaw, PA-C

## 2023-07-22 NOTE — Patient Instructions (Signed)
 Tennis Elbow Rehab Ask your health care provider which exercises are safe for you. Do exercises exactly as told by your health care provider and adjust them as directed. It is normal to feel mild stretching, pulling, tightness, or discomfort as you do these exercises. Stop right away if you feel sudden pain or your pain gets worse. Do not begin these exercises until told by your health care provider. Stretching and range-of-motion exercises These exercises warm up your muscles and joints and improve the movement and flexibility of your elbow. Wrist flexion, assisted  Straighten your left / right elbow in front of you with your palm facing down toward the floor. If told by your health care provider, bend your left / right elbow to a 90-degree angle (right angle) at your side instead of holding it straight. With your other hand, gently push over the back of your left / right hand so your fingers point toward the floor (flexion). Stop when you feel a gentle stretch on the back of your forearm. Hold this position for __________ seconds. Repeat __________ times. Complete this exercise __________ times a day. Wrist extension, assisted  Straighten your left / right elbow in front of you with your palm facing up toward the ceiling. If told by your health care provider, bend your left / right elbow to a 90-degree angle (right angle) at your side instead of holding it straight. With your other hand, gently pull your left / right hand and fingers toward the floor (extension). Stop when you feel a gentle stretch on the palm side of your forearm. Hold this position for __________ seconds. Repeat __________ times. Complete this exercise __________ times a day. Assisted forearm rotation, supination Sit or stand with your elbows at your side. Bend your left / right elbow to a 90-degree angle (right angle). Using your uninjured hand, turn your left / right palm up toward the ceiling (supination) until you feel a  gentle stretch along the inside of your forearm. Hold this position for __________ seconds. Repeat __________ times. Complete this exercise __________ times a day. Assisted forearm rotation, pronation Sit or stand with your elbows at your side. Bend your left / right elbow to a 90-degree angle (right angle). Using your uninjured hand, turn your left / right palm down toward the floor (pronation) until you feel a gentle stretch along the outside of your forearm. Hold this position for __________ seconds. Repeat __________ times. Complete this exercise __________ times a day. Strengthening exercises These exercises build strength and endurance in your forearm and elbow. Endurance is the ability to use your muscles for a long time, even after they get tired. Radial deviation  Stand with a __________ weight or a hammer in your left / right hand. Or, sit while holding a rubber exercise band or tubing, with your left / right forearm supported on a table or countertop. Position your forearm so that the thumb is facing the ceiling, as if you are going to clap your hands. This is the neutral position. Raise your hand upward in front of you so your thumb moves toward the ceiling (radial deviation), or pull up on the rubber tubing. Keep your forearm and elbow still while you move your wrist only. Hold this position for __________ seconds. Slowly return to the starting position. Repeat __________ times. Complete this exercise __________ times a day. Wrist extension, eccentric Sit with your left / right forearm palm-down and supported on a table or other surface. Let your left /  right wrist extend over the edge of the surface. Hold a __________ weight or a piece of exercise band or tubing in your left / right hand. If using a rubber exercise band or tubing, hold the other end of the tubing with your other hand. Use your uninjured hand to move your left / right hand up toward the ceiling. Take your  uninjured hand away and slowly return to the starting position using only your left / right hand. Lowering your arm under tension is called eccentric extension. Repeat __________ times. Complete this exercise __________ times a day. Wrist extension Do not do this exercise if it causes pain at the outside of your elbow. Only do this exercise once instructed by your health care provider. Sit with your left / right forearm supported on a table or other surface and your palm turned down toward the floor. Let your left / right wrist extend over the edge of the surface. Hold a __________ weight or a piece of rubber exercise band or tubing. If you are using a rubber exercise band or tubing, hold the band or tubing in place with your other hand to provide resistance. Slowly bend your wrist so your hand moves up toward the ceiling (extension). Move only your wrist, keeping your forearm and elbow still. Hold this position for __________ seconds. Slowly return to the starting position. Repeat __________ times. Complete this exercise __________ times a day. Forearm rotation, supination To do this exercise, you will need a lightweight hammer or rubber mallet. Sit with your left / right forearm supported on a table or other surface. Bend your elbow to a 90-degree angle (right angle). Position your forearm so that your palm is facing down toward the floor, with your hand resting over the edge of the table. Hold a hammer in your left / right hand. To make this exercise easier, hold the hammer near the head of the hammer. To make this exercise harder, hold the hammer near the end of the handle. Without moving your wrist or elbow, slowly rotate your forearm so your palm faces up toward the ceiling (supination). Hold this position for __________ seconds. Slowly return to the starting position. Repeat __________ times. Complete this exercise __________ times a day. Shoulder blade squeeze Sit in a stable chair or  stand with good posture. If you are sitting down, do not let your back touch the back of the chair. Your arms should be at your sides with your elbows bent to a 90-degree angle (right angle). Position your forearms so that your thumbs are facing the ceiling (neutral position). Without lifting your shoulders up, squeeze your shoulder blades tightly together. Hold this position for __________ seconds. Slowly release and return to the starting position. Repeat __________ times. Complete this exercise __________ times a day. This information is not intended to replace advice given to you by your health care provider. Make sure you discuss any questions you have with your health care provider. Document Revised: 12/07/2019 Document Reviewed: 12/09/2019 [employer]vier Patient Education  2024 [employer].

## 2023-07-23 ENCOUNTER — Encounter: Payer: Self-pay | Admitting: Physician Assistant

## 2023-07-23 DIAGNOSIS — E162 Hypoglycemia, unspecified: Secondary | ICD-10-CM | POA: Insufficient documentation

## 2023-07-23 DIAGNOSIS — M7712 Lateral epicondylitis, left elbow: Secondary | ICD-10-CM | POA: Insufficient documentation

## 2023-10-05 ENCOUNTER — Other Ambulatory Visit: Payer: Self-pay | Admitting: Physician Assistant

## 2023-10-05 DIAGNOSIS — E1169 Type 2 diabetes mellitus with other specified complication: Secondary | ICD-10-CM

## 2023-10-11 ENCOUNTER — Encounter: Payer: 59 | Admitting: Physician Assistant

## 2023-12-08 ENCOUNTER — Other Ambulatory Visit: Payer: Self-pay | Admitting: Physician Assistant

## 2023-12-08 DIAGNOSIS — E1169 Type 2 diabetes mellitus with other specified complication: Secondary | ICD-10-CM

## 2024-06-02 ENCOUNTER — Encounter: Payer: Self-pay | Admitting: Sports Medicine

## 2024-06-05 ENCOUNTER — Other Ambulatory Visit: Payer: Self-pay | Admitting: Physician Assistant

## 2024-06-05 DIAGNOSIS — E1169 Type 2 diabetes mellitus with other specified complication: Secondary | ICD-10-CM

## 2024-06-30 NOTE — Telephone Encounter (Signed)
 error
